# Patient Record
Sex: Female | Born: 1965 | Race: Black or African American | Hispanic: No | Marital: Single | Smoking: Never smoker
Health system: Southern US, Community
[De-identification: ages and names within clinical notes are randomized; demographics above are authoritative.]

## PROBLEM LIST (undated history)

## (undated) DIAGNOSIS — I10 Essential (primary) hypertension: Secondary | ICD-10-CM

## (undated) DIAGNOSIS — Z9884 Bariatric surgery status: Secondary | ICD-10-CM

## (undated) DIAGNOSIS — M170 Bilateral primary osteoarthritis of knee: Secondary | ICD-10-CM

## (undated) DIAGNOSIS — J302 Other seasonal allergic rhinitis: Secondary | ICD-10-CM

## (undated) DIAGNOSIS — I519 Heart disease, unspecified: Secondary | ICD-10-CM

## (undated) DIAGNOSIS — E611 Iron deficiency: Secondary | ICD-10-CM

## (undated) DIAGNOSIS — I4891 Unspecified atrial fibrillation: Secondary | ICD-10-CM

## (undated) DIAGNOSIS — I340 Nonrheumatic mitral (valve) insufficiency: Secondary | ICD-10-CM

## (undated) DIAGNOSIS — E8881 Metabolic syndrome: Secondary | ICD-10-CM

## (undated) DIAGNOSIS — M5430 Sciatica, unspecified side: Secondary | ICD-10-CM

## (undated) DIAGNOSIS — R7301 Impaired fasting glucose: Secondary | ICD-10-CM

## (undated) DIAGNOSIS — G8929 Other chronic pain: Secondary | ICD-10-CM

## (undated) DIAGNOSIS — J0110 Acute frontal sinusitis, unspecified: Secondary | ICD-10-CM

## (undated) DIAGNOSIS — M25562 Pain in left knee: Secondary | ICD-10-CM

## (undated) DIAGNOSIS — M1990 Unspecified osteoarthritis, unspecified site: Secondary | ICD-10-CM

## (undated) DIAGNOSIS — J309 Allergic rhinitis, unspecified: Secondary | ICD-10-CM

## (undated) DIAGNOSIS — J45909 Unspecified asthma, uncomplicated: Secondary | ICD-10-CM

## (undated) DIAGNOSIS — M545 Low back pain: Secondary | ICD-10-CM

## (undated) DIAGNOSIS — Z8679 Personal history of other diseases of the circulatory system: Secondary | ICD-10-CM

## (undated) DIAGNOSIS — M25561 Pain in right knee: Secondary | ICD-10-CM

## (undated) DIAGNOSIS — E785 Hyperlipidemia, unspecified: Secondary | ICD-10-CM

## (undated) DIAGNOSIS — F329 Major depressive disorder, single episode, unspecified: Secondary | ICD-10-CM

## (undated) HISTORY — DX: Allergic rhinitis, unspecified: J30.9

## (undated) HISTORY — DX: Low back pain: M54.5

## (undated) HISTORY — DX: Hyperlipidemia, unspecified: E78.5

## (undated) HISTORY — DX: Acute frontal sinusitis, unspecified: J01.10

## (undated) HISTORY — DX: Nonrheumatic mitral (valve) insufficiency: I34.0

## (undated) HISTORY — DX: Morbid (severe) obesity due to excess calories: E66.01

## (undated) HISTORY — DX: Major depressive disorder, single episode, unspecified: F32.9

## (undated) HISTORY — DX: Metabolic syndrome: E88.81

## (undated) HISTORY — DX: Sciatica, unspecified side: M54.30

## (undated) HISTORY — DX: Other seasonal allergic rhinitis: J30.2

## (undated) HISTORY — DX: Unspecified atrial fibrillation: I48.91

## (undated) HISTORY — DX: Heart disease, unspecified: I51.9

## (undated) HISTORY — DX: Impaired fasting glucose: R73.01

## (undated) HISTORY — DX: Bariatric surgery status: Z98.84

## (undated) HISTORY — DX: Personal history of other diseases of the circulatory system: Z86.79

## (undated) HISTORY — DX: Essential (primary) hypertension: I10

## (undated) HISTORY — DX: Pain in right knee: M25.561

## (undated) HISTORY — PX: LAPAROSCOPIC GASTRIC SLEEVE RESECTION: SHX5895

## (undated) HISTORY — DX: Other chronic pain: G89.29

## (undated) HISTORY — DX: Iron deficiency: E61.1

## (undated) HISTORY — DX: Pain in left knee: M25.562

## (undated) HISTORY — DX: Bilateral primary osteoarthritis of knee: M17.0

## (undated) HISTORY — PX: PARTIAL HYSTERECTOMY: SHX80

---

## 1999-12-31 ENCOUNTER — Emergency Department (HOSPITAL_COMMUNITY): Admission: EM | Admit: 1999-12-31 | Discharge: 2000-01-01 | Payer: Self-pay | Admitting: Emergency Medicine

## 2000-04-09 ENCOUNTER — Emergency Department (HOSPITAL_COMMUNITY): Admission: EM | Admit: 2000-04-09 | Discharge: 2000-04-09 | Payer: Self-pay | Admitting: Emergency Medicine

## 2000-04-12 ENCOUNTER — Emergency Department (HOSPITAL_COMMUNITY): Admission: EM | Admit: 2000-04-12 | Discharge: 2000-04-12 | Payer: Self-pay | Admitting: Emergency Medicine

## 2001-05-23 ENCOUNTER — Emergency Department (HOSPITAL_COMMUNITY): Admission: EM | Admit: 2001-05-23 | Discharge: 2001-05-23 | Payer: Self-pay

## 2001-08-21 ENCOUNTER — Encounter: Admission: RE | Admit: 2001-08-21 | Discharge: 2001-08-21 | Payer: Self-pay | Admitting: Family Medicine

## 2001-08-21 ENCOUNTER — Encounter: Payer: Self-pay | Admitting: Family Medicine

## 2001-08-29 ENCOUNTER — Encounter: Payer: Self-pay | Admitting: Family Medicine

## 2001-08-29 ENCOUNTER — Encounter: Admission: RE | Admit: 2001-08-29 | Discharge: 2001-08-29 | Payer: Self-pay | Admitting: Family Medicine

## 2002-06-13 ENCOUNTER — Other Ambulatory Visit: Admission: RE | Admit: 2002-06-13 | Discharge: 2002-06-13 | Payer: Self-pay | Admitting: Obstetrics and Gynecology

## 2002-06-15 ENCOUNTER — Ambulatory Visit (HOSPITAL_COMMUNITY): Admission: RE | Admit: 2002-06-15 | Discharge: 2002-06-15 | Payer: Self-pay | Admitting: Obstetrics and Gynecology

## 2002-06-15 ENCOUNTER — Encounter: Payer: Self-pay | Admitting: Obstetrics and Gynecology

## 2002-08-06 ENCOUNTER — Encounter: Payer: Self-pay | Admitting: Obstetrics and Gynecology

## 2002-08-06 ENCOUNTER — Ambulatory Visit (HOSPITAL_COMMUNITY): Admission: RE | Admit: 2002-08-06 | Discharge: 2002-08-06 | Payer: Self-pay | Admitting: Obstetrics and Gynecology

## 2009-05-14 ENCOUNTER — Emergency Department (HOSPITAL_COMMUNITY): Admission: EM | Admit: 2009-05-14 | Discharge: 2009-05-14 | Payer: Self-pay | Admitting: Emergency Medicine

## 2010-08-17 LAB — BASIC METABOLIC PANEL
BUN: 5 mg/dL — ABNORMAL LOW (ref 6–23)
Chloride: 103 mEq/L (ref 96–112)
Glucose, Bld: 93 mg/dL (ref 70–99)
Potassium: 3.5 mEq/L (ref 3.5–5.1)
Sodium: 137 mEq/L (ref 135–145)

## 2010-08-17 LAB — CBC
HCT: 30.5 % — ABNORMAL LOW (ref 36.0–46.0)
Hemoglobin: 10 g/dL — ABNORMAL LOW (ref 12.0–15.0)
MCV: 74.8 fL — ABNORMAL LOW (ref 78.0–100.0)
Platelets: 776 10*3/uL — ABNORMAL HIGH (ref 150–400)
RDW: 16.9 % — ABNORMAL HIGH (ref 11.5–15.5)

## 2010-08-17 LAB — DIFFERENTIAL
Basophils Absolute: 0.1 10*3/uL (ref 0.0–0.1)
Eosinophils Absolute: 0.1 10*3/uL (ref 0.0–0.7)
Eosinophils Relative: 1 % (ref 0–5)
Lymphocytes Relative: 14 % (ref 12–46)
Lymphs Abs: 1.6 10*3/uL (ref 0.7–4.0)
Monocytes Absolute: 1 10*3/uL (ref 0.1–1.0)

## 2010-08-17 LAB — BRAIN NATRIURETIC PEPTIDE: Pro B Natriuretic peptide (BNP): 320 pg/mL — ABNORMAL HIGH (ref 0.0–100.0)

## 2010-08-17 LAB — POCT CARDIAC MARKERS: Troponin i, poc: <0.05 ng/mL (ref 0.00–0.09)

## 2011-12-10 DIAGNOSIS — E611 Iron deficiency: Secondary | ICD-10-CM

## 2011-12-10 DIAGNOSIS — M5430 Sciatica, unspecified side: Secondary | ICD-10-CM

## 2011-12-10 HISTORY — DX: Sciatica, unspecified side: M54.30

## 2011-12-10 HISTORY — DX: Iron deficiency: E61.1

## 2012-11-06 DIAGNOSIS — R7301 Impaired fasting glucose: Secondary | ICD-10-CM

## 2012-11-06 DIAGNOSIS — K760 Fatty (change of) liver, not elsewhere classified: Secondary | ICD-10-CM | POA: Insufficient documentation

## 2012-11-06 HISTORY — DX: Impaired fasting glucose: R73.01

## 2013-02-07 DIAGNOSIS — I4891 Unspecified atrial fibrillation: Secondary | ICD-10-CM

## 2013-02-07 DIAGNOSIS — J302 Other seasonal allergic rhinitis: Secondary | ICD-10-CM

## 2013-02-07 HISTORY — DX: Unspecified atrial fibrillation: I48.91

## 2013-02-07 HISTORY — DX: Other seasonal allergic rhinitis: J30.2

## 2013-03-15 DIAGNOSIS — J45909 Unspecified asthma, uncomplicated: Secondary | ICD-10-CM | POA: Insufficient documentation

## 2013-03-15 DIAGNOSIS — M1990 Unspecified osteoarthritis, unspecified site: Secondary | ICD-10-CM | POA: Insufficient documentation

## 2013-04-26 DIAGNOSIS — I340 Nonrheumatic mitral (valve) insufficiency: Secondary | ICD-10-CM

## 2013-04-26 HISTORY — DX: Nonrheumatic mitral (valve) insufficiency: I34.0

## 2013-05-01 DIAGNOSIS — I519 Heart disease, unspecified: Secondary | ICD-10-CM

## 2013-05-01 HISTORY — DX: Heart disease, unspecified: I51.9

## 2013-06-01 DIAGNOSIS — K912 Postsurgical malabsorption, not elsewhere classified: Secondary | ICD-10-CM | POA: Insufficient documentation

## 2013-06-12 DIAGNOSIS — Z9884 Bariatric surgery status: Secondary | ICD-10-CM | POA: Insufficient documentation

## 2013-06-12 HISTORY — DX: Bariatric surgery status: Z98.84

## 2013-06-15 DIAGNOSIS — J309 Allergic rhinitis, unspecified: Secondary | ICD-10-CM | POA: Insufficient documentation

## 2013-06-15 HISTORY — DX: Allergic rhinitis, unspecified: J30.9

## 2013-10-26 DIAGNOSIS — M170 Bilateral primary osteoarthritis of knee: Secondary | ICD-10-CM

## 2013-10-26 HISTORY — DX: Bilateral primary osteoarthritis of knee: M17.0

## 2014-06-13 ENCOUNTER — Emergency Department (HOSPITAL_COMMUNITY)
Admission: EM | Admit: 2014-06-13 | Discharge: 2014-06-13 | Disposition: A | Discharge status: Home / Self Care | Payer: Self-pay | Attending: Emergency Medicine | Admitting: Emergency Medicine

## 2014-06-13 ENCOUNTER — Encounter (HOSPITAL_COMMUNITY): Payer: Self-pay | Admitting: Emergency Medicine

## 2014-06-13 ENCOUNTER — Emergency Department (HOSPITAL_COMMUNITY): Payer: Self-pay

## 2014-06-13 DIAGNOSIS — R05 Cough: Secondary | ICD-10-CM

## 2014-06-13 DIAGNOSIS — J069 Acute upper respiratory infection, unspecified: Secondary | ICD-10-CM | POA: Insufficient documentation

## 2014-06-13 DIAGNOSIS — R059 Cough, unspecified: Secondary | ICD-10-CM

## 2014-06-13 DIAGNOSIS — Z8701 Personal history of pneumonia (recurrent): Secondary | ICD-10-CM | POA: Insufficient documentation

## 2014-06-13 DIAGNOSIS — J45909 Unspecified asthma, uncomplicated: Secondary | ICD-10-CM | POA: Insufficient documentation

## 2014-06-13 DIAGNOSIS — Z8739 Personal history of other diseases of the musculoskeletal system and connective tissue: Secondary | ICD-10-CM | POA: Insufficient documentation

## 2014-06-13 HISTORY — DX: Unspecified asthma, uncomplicated: J45.909

## 2014-06-13 HISTORY — DX: Unspecified osteoarthritis, unspecified site: M19.90

## 2014-06-13 MED ORDER — HYDROCOD POLST-CHLORPHEN POLST 10-8 MG/5ML PO LQCR: 5.0000 mL | Freq: Two times a day (BID) | ORAL | Status: DC | PRN

## 2014-06-13 NOTE — ED Notes (Signed)
Pt c/o URI sx and cough x 1 week; pt sts some fever but not at present

## 2014-06-13 NOTE — ED Provider Notes (Signed)
CSN: [id number]     Arrival date & time 06/13/14  1545 History   First MD Initiated Contact with Patient 06/13/14 1630     Chief Complaint  Patient presents with  . URI  . Cough     (Consider location/radiation/quality/duration/timing/severity/associated sxs/prior Treatment) HPI  49 year old female presents with cough, congestion, headache, and sore throat for the past 1 week. Earlier had low-grade fevers, did not check her temperature. This fevers have resolved. No shortness of breath. No chest pain. Does feel like her chest is congested. Off was initially productive and is now nonproductive. A couple years ago she had similar symptoms, waited too long, and then was diagnosed with pneumonia. She is coming to the ER to make sure he does not pneumonia. Patient is not a smoker. Does endorse a prior history of bronchitis as well. She's been taking Mucinex and Tussen with no relief. Symptoms have not been worsening but have not improved over the 1 week.  Past Medical History  Diagnosis Date  . Arthritis   . Asthma    History reviewed. No pertinent past surgical history. History reviewed. No pertinent family history. History  Substance Use Topics  . Smoking status: Never Smoker   . Smokeless tobacco: Not on file  . Alcohol Use: No   OB History    No data available     Review of Systems  Constitutional: Negative for fever and chills.  HENT: Positive for congestion, sinus pressure and sore throat.   Respiratory: Positive for cough. Negative for shortness of breath and wheezing.   Cardiovascular: Negative for chest pain.  Gastrointestinal: Negative for vomiting.  Neurological: Positive for headaches.  All other systems reviewed and are negative.     Allergies  Review of patient's allergies indicates no known allergies.  Home Medications   Prior to Admission medications   Not on File   BP 135/92 mmHg  Pulse 90  Temp(Src) 98.2 F (36.8 C) (Oral)  Resp 18  SpO2  100% Physical Exam  Constitutional: She is oriented to person, place, and time. She appears well-developed and well-nourished.  HENT:  Head: Normocephalic and atraumatic.  Right Ear: Tympanic membrane and external ear normal.  Left Ear: Tympanic membrane and external ear normal.  Nose: Right sinus exhibits frontal sinus tenderness (mild). Right sinus exhibits no maxillary sinus tenderness. Left sinus exhibits frontal sinus tenderness (mild). Left sinus exhibits no maxillary sinus tenderness.  Mouth/Throat: Oropharynx is clear and moist and mucous membranes are normal. No oropharyngeal exudate, posterior oropharyngeal edema or posterior oropharyngeal erythema.  Eyes: Right eye exhibits no discharge. Left eye exhibits no discharge.  Neck: Neck supple.  Cardiovascular: Normal rate, regular rhythm and normal heart sounds.   Pulmonary/Chest: Effort normal and breath sounds normal. She has no wheezes. She has no rales.  Abdominal: Soft. There is no tenderness.  Lymphadenopathy:    She has no cervical adenopathy.  Neurological: She is alert and oriented to person, place, and time.  Skin: Skin is warm and dry.  Nursing note and vitals reviewed.   ED Course  Procedures (including critical care time) Labs Review Labs Reviewed - No data to display  Imaging Review Dg Chest 2 View (if Patient Has Fever And/or Copd)  06/13/2014   CLINICAL DATA:  Cough and congestion.  History of asthma.  EXAM: CHEST - 2 VIEW  COMPARISON:  05/14/2009  FINDINGS: The heart size and mediastinal contours are within normal limits. There is no evidence of pulmonary edema, consolidation, pneumothorax, nodule or  pleural fluid. The visualized skeletal structures are unremarkable.  IMPRESSION: No active disease.   Electronically Signed   By: Irish LackGlenn  Yamagata M.D.   On: 06/13/2014 17:21     EKG Interpretation None      MDM   Final diagnoses:  Cough  Upper respiratory infection    Patient symptoms are c/w a viral URI.  No evidence of PNA. Well appearing, appears well hydrated. Will treat cough with Tussionex, and treat other URI symptoms with symptomatic over-the-counter care. Mild sinus tenderness but only 1 week of symptoms, in setting of URI I do not feel she needs antibiotics at this time, will recommend f/u with PCP    Audree CamelScott T Allycia Pitz, MD 06/13/14 1801

## 2014-06-13 NOTE — Discharge Instructions (Signed)
Cough, Adult ° A cough is a reflex that helps clear your throat and airways. It can help heal the body or may be a reaction to an irritated airway. A cough may only last 2 or 3 weeks (acute) or may last more than 8 weeks (chronic).  °CAUSES °Acute cough: °· Viral or bacterial infections. °Chronic cough: °· Infections. °· Allergies. °· Asthma. °· Post-nasal drip. °· Smoking. °· Heartburn or acid reflux. °· Some medicines. °· Chronic lung problems (COPD). °· Cancer. °SYMPTOMS  °· Cough. °· Fever. °· Chest pain. °· Increased breathing rate. °· High-pitched whistling sound when breathing (wheezing). °· Colored mucus that you cough up (sputum). °TREATMENT  °· A bacterial cough may be treated with antibiotic medicine. °· A viral cough must run its course and will not respond to antibiotics. °· Your caregiver may recommend other treatments if you have a chronic cough. °HOME CARE INSTRUCTIONS  °· Only take over-the-counter or prescription medicines for pain, discomfort, or fever as directed by your caregiver. Use cough suppressants only as directed by your caregiver. °· Use a cold steam vaporizer or humidifier in your bedroom or home to help loosen secretions. °· Sleep in a semi-upright position if your cough is worse at night. °· Rest as needed. °· Stop smoking if you smoke. °SEEK IMMEDIATE MEDICAL CARE IF:  °· You have pus in your sputum. °· Your cough starts to worsen. °· You cannot control your cough with suppressants and are losing sleep. °· You begin coughing up blood. °· You have difficulty breathing. °· You develop pain which is getting worse or is uncontrolled with medicine. °· You have a fever. °MAKE SURE YOU:  °· Understand these instructions. °· Will watch your condition. °· Will get help right away if you are not doing well or get worse. °Document Released: 10/30/2010 Document Revised: 07/26/2011 Document Reviewed: 10/30/2010 °ExitCare® Patient Information ©2015 [employer]. This information is not intended  to replace advice given to you by your health care provider. Make sure you discuss any questions you have with your health care provider. °Upper Respiratory Infection, Adult °An upper respiratory infection (URI) is also sometimes known as the common cold. The upper respiratory tract includes the nose, sinuses, throat, trachea, and bronchi. Bronchi are the airways leading to the lungs. Most people improve within 1 week, but symptoms can last up to 2 weeks. A residual cough may last even longer.  °CAUSES °Many different viruses can infect the tissues lining the upper respiratory tract. The tissues become irritated and inflamed and often become very moist. Mucus production is also common. A cold is contagious. You can easily spread the virus to others by oral contact. This includes kissing, sharing a glass, coughing, or sneezing. Touching your mouth or nose and then touching a surface, which is then touched by another person, can also spread the virus. °SYMPTOMS  °Symptoms typically develop 1 to 3 days after you come in contact with a cold virus. Symptoms vary from person to person. They may include: °· Runny nose. °· Sneezing. °· Nasal congestion. °· Sinus irritation. °· Sore throat. °· Loss of voice (laryngitis). °· Cough. °· Fatigue. °· Muscle aches. °· Loss of appetite. °· Headache. °· Low-grade fever. °DIAGNOSIS  °You might diagnose your own cold based on familiar symptoms, since most people get a cold 2 to 3 times a year. Your caregiver can confirm this based on your exam. Most importantly, your caregiver can check that your symptoms are not due to another disease such   as strep throat, sinusitis, pneumonia, asthma, or epiglottitis. Blood tests, throat tests, and X-rays are not necessary to diagnose a common cold, but they may sometimes be helpful in excluding other more serious diseases. Your caregiver will decide if any further tests are required. °RISKS AND COMPLICATIONS  °You may be at risk for a more severe  case of the common cold if you smoke cigarettes, have chronic heart disease (such as heart failure) or lung disease (such as asthma), or if you have a weakened immune system. The very young and very old are also at risk for more serious infections. Bacterial sinusitis, middle ear infections, and bacterial pneumonia can complicate the common cold. The common cold can worsen asthma and chronic obstructive pulmonary disease (COPD). Sometimes, these complications can require emergency medical care and may be life-threatening. °PREVENTION  °The best way to protect against getting a cold is to practice good hygiene. Avoid oral or hand contact with people with cold symptoms. Wash your hands often if contact occurs. There is no clear evidence that vitamin C, vitamin E, echinacea, or exercise reduces the chance of developing a cold. However, it is always recommended to get plenty of rest and practice good nutrition. °TREATMENT  °Treatment is directed at relieving symptoms. There is no cure. Antibiotics are not effective, because the infection is caused by a virus, not by bacteria. Treatment may include: °· Increased fluid intake. Sports drinks offer valuable electrolytes, sugars, and fluids. °· Breathing heated mist or steam (vaporizer or shower). °· Eating chicken soup or other clear broths, and maintaining good nutrition. °· Getting plenty of rest. °· Using gargles or lozenges for comfort. °· Controlling fevers with ibuprofen or acetaminophen as directed by your caregiver. °· Increasing usage of your inhaler if you have asthma. °Zinc gel and zinc lozenges, taken in the first 24 hours of the common cold, can shorten the duration and lessen the severity of symptoms. Pain medicines may help with fever, muscle aches, and throat pain. A variety of non-prescription medicines are available to treat congestion and runny nose. Your caregiver can make recommendations and may suggest nasal or lung inhalers for other symptoms.  °HOME  CARE INSTRUCTIONS  °· Only take over-the-counter or prescription medicines for pain, discomfort, or fever as directed by your caregiver. °· Use a warm mist humidifier or inhale steam from a shower to increase air moisture. This may keep secretions moist and make it easier to breathe. °· Drink enough water and fluids to keep your urine clear or pale yellow. °· Rest as needed. °· Return to work when your temperature has returned to normal or as your caregiver advises. You may need to stay home longer to avoid infecting others. You can also use a face mask and careful hand washing to prevent spread of the virus. °SEEK MEDICAL CARE IF:  °· After the first few days, you feel you are getting worse rather than better. °· You need your caregiver's advice about medicines to control symptoms. °· You develop chills, worsening shortness of breath, or brown or red sputum. These may be signs of pneumonia. °· You develop yellow or brown nasal discharge or pain in the face, especially when you bend forward. These may be signs of sinusitis. °· You develop a fever, swollen neck glands, pain with swallowing, or white areas in the back of your throat. These may be signs of strep throat. °SEEK IMMEDIATE MEDICAL CARE IF:  °· You have a fever. °· You develop severe or persistent headache, ear   pain, sinus pain, or chest pain. °· You develop wheezing, a prolonged cough, cough up blood, or have a change in your usual mucus (if you have chronic lung disease). °· You develop sore muscles or a stiff neck. °Document Released: 10/27/2000 Document Revised: 07/26/2011 Document Reviewed: 08/08/2013 °ExitCare® Patient Information ©2015 [employer]. This information is not intended to replace advice given to you by your health care provider. Make sure you discuss any questions you have with your health care provider. ° °

## 2015-01-08 ENCOUNTER — Emergency Department (HOSPITAL_COMMUNITY): Payer: Self-pay

## 2015-01-08 ENCOUNTER — Encounter (HOSPITAL_COMMUNITY): Payer: Self-pay | Admitting: Emergency Medicine

## 2015-01-08 ENCOUNTER — Emergency Department (HOSPITAL_COMMUNITY)
Admission: EM | Admit: 2015-01-08 | Discharge: 2015-01-08 | Disposition: A | Discharge status: Home / Self Care | Payer: Self-pay | Attending: Emergency Medicine | Admitting: Emergency Medicine

## 2015-01-08 DIAGNOSIS — M546 Pain in thoracic spine: Secondary | ICD-10-CM | POA: Insufficient documentation

## 2015-01-08 DIAGNOSIS — R109 Unspecified abdominal pain: Secondary | ICD-10-CM | POA: Insufficient documentation

## 2015-01-08 DIAGNOSIS — J45909 Unspecified asthma, uncomplicated: Secondary | ICD-10-CM | POA: Insufficient documentation

## 2015-01-08 DIAGNOSIS — Z8679 Personal history of other diseases of the circulatory system: Secondary | ICD-10-CM | POA: Insufficient documentation

## 2015-01-08 HISTORY — DX: Unspecified atrial fibrillation: I48.91

## 2015-01-08 LAB — URINALYSIS, ROUTINE W REFLEX MICROSCOPIC
BILIRUBIN URINE: NEGATIVE
GLUCOSE, UA: NEGATIVE mg/dL
HGB URINE DIPSTICK: NEGATIVE
KETONES UR: NEGATIVE mg/dL
Leukocytes, UA: NEGATIVE
NITRITE: NEGATIVE
PH: 5.5 (ref 5.0–8.0)
Protein, ur: NEGATIVE mg/dL
SPECIFIC GRAVITY, URINE: 1.021 (ref 1.005–1.030)
Urobilinogen, UA: 0.2 mg/dL (ref 0.0–1.0)

## 2015-01-08 LAB — BASIC METABOLIC PANEL
Anion gap: 13 (ref 5–15)
BUN: 8 mg/dL (ref 6–20)
CALCIUM: 8.9 mg/dL (ref 8.9–10.3)
CHLORIDE: 102 mmol/L (ref 101–111)
CO2: 23 mmol/L (ref 22–32)
CREATININE: 0.93 mg/dL (ref 0.44–1.00)
GFR calc non Af Amer: >60 mL/min (ref >60)
Glucose, Bld: 97 mg/dL (ref 65–99)
Potassium: 4 mmol/L (ref 3.5–5.1)
SODIUM: 138 mmol/L (ref 135–145)

## 2015-01-08 LAB — CBC WITH DIFFERENTIAL/PLATELET
BASOS PCT: 0 % (ref 0–1)
Basophils Absolute: 0 10*3/uL (ref 0.0–0.1)
EOS ABS: 0.1 10*3/uL (ref 0.0–0.7)
Eosinophils Relative: 1 % (ref 0–5)
HCT: 41.8 % (ref 36.0–46.0)
HEMOGLOBIN: 13.8 g/dL (ref 12.0–15.0)
LYMPHS ABS: 1.9 10*3/uL (ref 0.7–4.0)
Lymphocytes Relative: 24 % (ref 12–46)
MCH: 28.6 pg (ref 26.0–34.0)
MCHC: 33 g/dL (ref 30.0–36.0)
MCV: 86.7 fL (ref 78.0–100.0)
MONO ABS: 0.8 10*3/uL (ref 0.1–1.0)
MONOS PCT: 10 % (ref 3–12)
NEUTROS PCT: 65 % (ref 43–77)
Neutro Abs: 4.9 10*3/uL (ref 1.7–7.7)
Platelets: 324 10*3/uL (ref 150–400)
RBC: 4.82 MIL/uL (ref 3.87–5.11)
RDW: 12.8 % (ref 11.5–15.5)
WBC: 7.7 10*3/uL (ref 4.0–10.5)

## 2015-01-08 MED ORDER — IBUPROFEN 800 MG PO TABS: 800.0000 mg | ORAL_TABLET | Freq: Three times a day (TID) | ORAL | Status: DC | PRN

## 2015-01-08 MED ORDER — OXYCODONE-ACETAMINOPHEN 5-325 MG PO TABS: 1.0000 | ORAL_TABLET | ORAL | Status: DC | PRN

## 2015-01-08 MED ORDER — OXYCODONE-ACETAMINOPHEN 5-325 MG PO TABS
2.0000 | ORAL_TABLET | Freq: Once | ORAL | Status: AC
  Administered 2015-01-08: 2 via ORAL
  Filled 2015-01-08: qty 2

## 2015-01-08 MED ORDER — ONDANSETRON 4 MG PO TBDP
4.0000 mg | ORAL_TABLET | Freq: Once | ORAL | Status: AC
  Administered 2015-01-08: 4 mg via ORAL
  Filled 2015-01-08: qty 1

## 2015-01-08 NOTE — ED Notes (Signed)
Pt stable, ambulatory, states understanding of discharge instructions 

## 2015-01-08 NOTE — ED Provider Notes (Signed)
TIME SEEN: 4:10 AM  CHIEF COMPLAINT: L flank pain  HPI: Pt is a 49 y.o. F with h.o morbid obesity, paroxysmal a fib, asthma who presents to the ED with left flank pain that started yesterday.  Denies known injury.  Denies chest pain or shortness of breath. No fevers or cough. No dysuria or hematuria. No abdominal pain. No vomiting or diarrhea. No numbness, tingling or focal weakness. Pain is worse with movement. Better with staying still. Denies history of kidney stones.  ROS: See HPI Constitutional: no fever  Eyes: no drainage  ENT: no runny nose   Cardiovascular:  no chest pain  Resp: no SOB  GI: no vomiting GU: no dysuria Integumentary: no rash  Allergy: no hives  Musculoskeletal: no leg swelling  Neurological: no slurred speech ROS otherwise negative  PAST MEDICAL HISTORY/PAST SURGICAL HISTORY:  Past Medical History  Diagnosis Date  . Arthritis   . Asthma   . A-fib     MEDICATIONS:  Prior to Admission medications   Medication Sig Start Date End Date Taking? Authorizing Provider  chlorpheniramine-HYDROcodone (TUSSIONEX PENNKINETIC ER) 10-8 MG/5ML LQCR Take 5 mLs by mouth every 12 (twelve) hours as needed for cough. 06/13/14   Pricilla Loveless, MD    ALLERGIES:  No Known Allergies  SOCIAL HISTORY:  Social History  Substance Use Topics  . Smoking status: Never Smoker   . Smokeless tobacco: Not on file  . Alcohol Use: No    FAMILY HISTORY: No family history on file.  EXAM: BP 119/75 mmHg  Pulse 86  Temp(Src) 98.6 F (37 C) (Oral)  Resp 20  Ht  (1.651 m)  Wt 276 lb (125.193 kg)  BMI 45.93 kg/m2  SpO2 99% CONSTITUTIONAL: Alert and oriented and responds appropriately to questions. Well-appearing; well-nourished, morbidly obese HEAD: Normocephalic EYES: Conjunctivae clear, PERRL ENT: normal nose; no rhinorrhea; moist mucous membranes; pharynx without lesions noted NECK: Supple, no meningismus, no LAD  CARD: RRR; S1 and S2 appreciated; no murmurs, no  clicks, no rubs, no gallops; tender to palpation over the left chest wall without crepitus or ecchymosis or deformity RESP: Normal chest excursion without splinting or tachypnea; breath sounds clear and equal bilaterally; no wheezes, no rhonchi, no rales, no hypoxia or respiratory distress, speaking full sentences ABD/GI: Normal bowel sounds; non-distended; soft, non-tender, no rebound, no guarding, no peritoneal signs BACK:  The back appears normal and is tender over the left flank, no midline spinal tenderness or step-off or deformity EXT: Normal ROM in all joints; non-tender to palpation; no edema; normal capillary refill; no cyanosis, no calf tenderness or swelling    SKIN: Normal color for age and race; warm NEURO: Moves all extremities equally, sensation to light touch intact diffusely, cranial nerves II through XII intact PSYCH: The patient's mood and manner are appropriate. Grooming and personal hygiene are appropriate.  MEDICAL DECISION MAKING: Patient here with left flank pain. Likely ask your skeletal in nature but given her morbid obesity exam is very limited. She is hemodynamically stable and neurologically intact. Will obtain labs, urine, left rib series, CT of her abdomen and pelvis to rule out kidney stone. We'll provide pain medication.  ED PROGRESS: Patient reports pain is improved significantly after Percocet. Labs unremarkable. Urine shows no hematuria or sign of infection. CT scan shows a small punctate stone in the left kidney but no ureterolithiasis. Chest x-ray shows mild cardiomegaly but no other acute abnormality. She also has mild hepatomegaly. Discussed these incidental findings with patient. She now  reports that she has begun working out at [employer] and has been doing "rowing" like exercises that she feels likely caused her left back pain. I feel she is safe to be discharged home. We'll discharge with pain medication, anti-inflammatory's. I recommended stretching,  alternate ice and heat as needed for pain. I do not feel she needs further emergent workup at this time. Discussed usual and customary return precautions. Provided outpatient follow-up information. She verbalized understanding and discomfort with this plan.     Kara Maw Corrado Hymon, DO 01/08/15 [phone]

## 2015-01-08 NOTE — ED Notes (Signed)
Pt reports L flank pain since yesterday. Denies urinary sx. Denies injury.

## 2015-01-08 NOTE — Discharge Instructions (Signed)
Thoracic Strain You have injured the muscles or tendons that attach to the upper part of your back behind your chest. This injury is called a thoracic strain, thoracic sprain, or mid-back strain.  CAUSES  The cause of thoracic strain varies. A less severe injury involves pulling a muscle or tendon without tearing it. A more severe injury involves tearing (rupturing) a muscle or tendon. With less severe injuries, there may be little loss of strength. Sometimes, there are breaks (fractures) in the bones to which the muscles are attached. These fractures are rare, unless there was a direct hit (trauma) or you have weak bones due to osteoporosis or age. Longstanding strains may be caused by overuse or improper form during certain movements. Obesity can also increase your risk for back injuries. Sudden strains may occur due to injury or not warming up properly before exercise. Often, there is no obvious cause for a thoracic strain. SYMPTOMS  The main symptom is pain, especially with movement, such as during exercise. DIAGNOSIS  Your caregiver can usually tell what is wrong by taking an X-ray and doing a physical exam. TREATMENT   Physical therapy may be helpful for recovery. Your caregiver can give you exercises to do or refer you to a physical therapist after your pain improves.  After your pain improves, strengthening and conditioning programs appropriate for your sport or occupation may be helpful.  Always warm up before physical activities or athletics. Stretching after physical activity may also help.  Certain over-the-counter medicines may also help. Ask your caregiver if there are medicines that would help you. If this is your first thoracic strain injury, proper care and proper healing time before starting activities should prevent long-term problems. Torn ligaments and tendons require as long to heal as broken bones. Average healing times may be only 1 week for a mild strain. For torn muscles  and tendons, healing time may be up to 6 weeks to 2 months. HOME CARE INSTRUCTIONS   Apply ice to the injured area. Ice massages may also be used as directed.  Put ice in a plastic bag.  Place a towel between your skin and the bag.  Leave the ice on for 15-20 minutes, 03-04 times a day, for the first 2 days.  Only take over-the-counter or prescription medicines for pain, discomfort, or fever as directed by your caregiver.  Keep your appointments for physical therapy if this was prescribed.  Use wraps and back braces as instructed. SEEK IMMEDIATE MEDICAL CARE IF:   You have an increase in bruising, swelling, or pain.  Your pain has not improved with medicines.  You develop new shortness of breath, chest pain, or fever.  Problems seem to be getting worse rather than better. MAKE SURE YOU:   Understand these instructions.  Will watch your condition.  Will get help right away if you are not doing well or get worse. Document Released: 07/24/2003 Document Revised: 07/26/2011 Document Reviewed: 06/19/2010 [employer] Patient Information 2015 [employer]. This information is not intended to replace advice given to you by your health care provider. Make sure you discuss any questions you have with your health care provider.  

## 2015-01-10 ENCOUNTER — Encounter (HOSPITAL_COMMUNITY): Payer: Self-pay | Admitting: Emergency Medicine

## 2015-01-10 ENCOUNTER — Emergency Department (HOSPITAL_COMMUNITY)
Admission: EM | Admit: 2015-01-10 | Discharge: 2015-01-10 | Disposition: A | Discharge status: Home / Self Care | Payer: Self-pay | Attending: Emergency Medicine | Admitting: Emergency Medicine

## 2015-01-10 DIAGNOSIS — M1990 Unspecified osteoarthritis, unspecified site: Secondary | ICD-10-CM | POA: Insufficient documentation

## 2015-01-10 DIAGNOSIS — Z79899 Other long term (current) drug therapy: Secondary | ICD-10-CM | POA: Insufficient documentation

## 2015-01-10 DIAGNOSIS — M5432 Sciatica, left side: Secondary | ICD-10-CM | POA: Insufficient documentation

## 2015-01-10 DIAGNOSIS — J45909 Unspecified asthma, uncomplicated: Secondary | ICD-10-CM | POA: Insufficient documentation

## 2015-01-10 LAB — CBC
HCT: 38.3 % (ref 36.0–46.0)
Hemoglobin: 12.7 g/dL (ref 12.0–15.0)
MCH: 28.5 pg (ref 26.0–34.0)
MCHC: 33.2 g/dL (ref 30.0–36.0)
MCV: 86.1 fL (ref 78.0–100.0)
PLATELETS: 334 10*3/uL (ref 150–400)
RBC: 4.45 MIL/uL (ref 3.87–5.11)
RDW: 12.6 % (ref 11.5–15.5)
WBC: 7.8 10*3/uL (ref 4.0–10.5)

## 2015-01-10 LAB — COMPREHENSIVE METABOLIC PANEL
ALBUMIN: 3.6 g/dL (ref 3.5–5.0)
ALK PHOS: 85 U/L (ref 38–126)
ALT: 14 U/L (ref 14–54)
AST: 22 U/L (ref 15–41)
Anion gap: 11 (ref 5–15)
BILIRUBIN TOTAL: 0.5 mg/dL (ref 0.3–1.2)
BUN: 13 mg/dL (ref 6–20)
CALCIUM: 9.2 mg/dL (ref 8.9–10.3)
CO2: 26 mmol/L (ref 22–32)
CREATININE: 0.89 mg/dL (ref 0.44–1.00)
Chloride: 101 mmol/L (ref 101–111)
GFR calc non Af Amer: >60 mL/min (ref >60)
GLUCOSE: 98 mg/dL (ref 65–99)
Potassium: 4 mmol/L (ref 3.5–5.1)
SODIUM: 138 mmol/L (ref 135–145)
TOTAL PROTEIN: 8 g/dL (ref 6.5–8.1)

## 2015-01-10 LAB — LIPASE, BLOOD: Lipase: 25 U/L (ref 22–51)

## 2015-01-10 MED ORDER — PREDNISONE 20 MG PO TABS: ORAL_TABLET | ORAL | Status: DC

## 2015-01-10 MED ORDER — PREDNISONE 20 MG PO TABS
60.0000 mg | ORAL_TABLET | Freq: Once | ORAL | Status: AC
  Administered 2015-01-10: 60 mg via ORAL
  Filled 2015-01-10 (×2): qty 3

## 2015-01-10 MED ORDER — METHOCARBAMOL 500 MG PO TABS
750.0000 mg | ORAL_TABLET | Freq: Once | ORAL | Status: AC
  Administered 2015-01-10: 750 mg via ORAL
  Filled 2015-01-10: qty 2

## 2015-01-10 MED ORDER — METHOCARBAMOL 500 MG PO TABS: 500.0000 mg | ORAL_TABLET | Freq: Two times a day (BID) | ORAL | Status: DC

## 2015-01-10 NOTE — ED Provider Notes (Signed)
CSN: [id number]     Arrival date & time 01/10/15  1850 History   First MD Initiated Contact with Patient 01/10/15 2140     Chief Complaint  Patient presents with  . Abdominal Pain  . Flank Pain     (Consider location/radiation/quality/duration/timing/severity/associated sxs/prior Treatment) HPI Comments: Patient was seen 2 days ago for the same complaint of left sided pain.  She states the pain starts under her ribs on the left side and extends into the hip and down the side and back of her left leg.  It's worse with position changes.  She is having trouble sleeping at night due to the muscle spasms.  She also is having severe muscle spasms with position change.  Her examination.  2 days ago revealed that she has a punctate kidney stone, but her labs were all significantly normal.  She was given a prescription for ibuprofen and Percocet, which she states is not helping her pain is just getting worse. She is morbidly obese but is been in a weight loss program and has had bariatric surgery.  She has lots of loose skin,  no obvious sign of a hernia.  Patient is a 49 y.o. female presenting with abdominal pain. The history is provided by the patient.  Abdominal Pain Pain location:  L flank Pain quality: aching   Pain radiates to:  Back and L leg Pain severity:  Moderate Onset quality:  Gradual Timing:  Constant Progression:  Worsening Chronicity:  Recurrent Relieved by:  Nothing Worsened by:  Palpation and position changes Ineffective treatments:  NSAIDs Associated symptoms: no cough, no dysuria, no fever, no hematuria, no nausea, no shortness of breath and no vomiting     Past Medical History  Diagnosis Date  . Arthritis   . Asthma   . A-fib    Past Surgical History  Procedure Laterality Date  . Partial hysterectomy     No family history on file. Social History  Substance Use Topics  . Smoking status: Never Smoker   . Smokeless tobacco: None  . Alcohol Use: No   OB History     No data available     Review of Systems  Constitutional: Negative for fever.  Respiratory: Negative for cough and shortness of breath.   Gastrointestinal: Positive for abdominal pain. Negative for nausea and vomiting.  Genitourinary: Positive for flank pain. Negative for dysuria and hematuria.  Musculoskeletal: Positive for back pain and gait problem. Negative for myalgias and neck pain.  Skin: Negative for rash and wound.  All other systems reviewed and are negative.     Allergies  Review of patient's allergies indicates no known allergies.  Home Medications   Prior to Admission medications   Medication Sig Start Date End Date Taking? Authorizing Provider  ibuprofen (ADVIL,MOTRIN) 800 MG tablet Take 1 tablet (800 mg total) by mouth every 8 (eight) hours as needed for mild pain. 01/08/15  Yes Kristen N Ward, DO  oxyCODONE-acetaminophen (PERCOCET/ROXICET) 5-325 MG per tablet Take 1 tablet by mouth every 4 (four) hours as needed. 01/08/15  Yes Kristen N Ward, DO  methocarbamol (ROBAXIN) 500 MG tablet Take 1 tablet (500 mg total) by mouth 2 (two) times daily. 01/10/15   Earley Favor, NP  predniSONE (DELTASONE) 20 MG tablet 3 Tabs PO Days 1-3, then 2 tabs PO Days 4-6, then 1 tab PO Day 7-9, then Half Tab PO Day 10-12 01/10/15   Earley Favor, NP   BP 132/72 mmHg  Pulse 82  Temp(Src) 98  F (36.7 C) (Oral)  Resp 18  SpO2 99% Physical Exam  Constitutional: She appears well-developed and well-nourished.  HENT:  Head: Normocephalic.  Eyes: Pupils are equal, round, and reactive to light.  Neck: Normal range of motion.  Cardiovascular: Normal rate and regular rhythm.   Pulmonary/Chest: Effort normal and breath sounds normal.  Abdominal: Soft. She exhibits no distension. There is no tenderness.  Exam is limited by body habitus  Musculoskeletal: Normal range of motion. She exhibits tenderness.       Arms: Nursing note and vitals reviewed.   ED Course  Procedures (including critical  care time) Labs Review Labs Reviewed  LIPASE, BLOOD  COMPREHENSIVE METABOLIC PANEL  CBC  URINALYSIS, ROUTINE W REFLEX MICROSCOPIC (NOT AT [facility])    Imaging Review No results found. I have personally reviewed and evaluated these images and lab results as part of my medical decision-making.   EKG Interpretation None     His examination is consistent with sciatica.  She will be put on a 12 day sterilely taper and Robaxin for muscle spasm  She has Percocet for pain control if needed.  I discussed at length positioning while sleeping.  Exercising.  She is also been given a number for case management  to call on Monday so that she can help her get/obtain a primary care physician as the patient did call the [facility] yesterday and was told they are not excepting the patient's MDM   Final diagnoses:  Sciatica neuralgia, left         Earley Favor, NP 01/10/15 2207  Doug Sou, MD 01/11/15 [id number]

## 2015-01-10 NOTE — ED Notes (Signed)
Pt. reports persistent left lateral abdominal and left flank pain onset last Wednesday with nausea , no emesis or diarrhea .

## 2015-01-10 NOTE — Discharge Instructions (Signed)
Given the first dose of muscle relaxer and steroidal in the emergency department.  You have  been given a prescription to fill start this tomorrow as directed.  Also have given her the phone number for one to our case manager/social worker, please call her on Monday after 9 AM she will help you in obtaining a primary care physician

## 2015-01-10 NOTE — ED Provider Notes (Signed)
Complains of pain going from left flank to left thigh improved with remaining still and worse with changing positions bending or getting up from [place] seated position. She denies any abdominal pain denies appetite changes no nausea or vomiting no fever no urinary symptoms. On exam patient is morbidly obese. Pain at left flank upon standing up from a seated position. Abdomen morbidly obese, nontender. Gait normal  Doug Sou, MD 01/10/15 2155

## 2015-01-13 ENCOUNTER — Telehealth: Payer: Self-pay | Admitting: Surgery

## 2015-01-13 NOTE — Telephone Encounter (Signed)
ED CM received call from patient regarding needing to establsih care for follow up. Reviewed record patient was seen in the ED 2 x last week for similar issues. Discussed the [facility] and the services rendered. Offered to assist with scheduling an appointment. Patient amendable, Appointment scheduled for Thurs. 9/8 at 9:30am with Holland Commons NP at the Clinic. Patient verbalized understanding and appreciation for the assistance. No further ED CM needs identfied.

## 2015-01-23 ENCOUNTER — Encounter: Payer: Self-pay | Admitting: Internal Medicine

## 2015-01-23 ENCOUNTER — Ambulatory Visit: Payer: Self-pay | Attending: Internal Medicine | Admitting: Internal Medicine

## 2015-01-23 VITALS — BP 121/83 | HR 76 | Temp 98.0°F | Resp 16 | Ht 65.0 in | Wt 293.2 lb

## 2015-01-23 DIAGNOSIS — Z6841 Body Mass Index (BMI) 40.0 and over, adult: Secondary | ICD-10-CM | POA: Insufficient documentation

## 2015-01-23 DIAGNOSIS — M5432 Sciatica, left side: Secondary | ICD-10-CM | POA: Insufficient documentation

## 2015-01-23 DIAGNOSIS — N200 Calculus of kidney: Secondary | ICD-10-CM | POA: Insufficient documentation

## 2015-01-23 MED ORDER — TRAMADOL HCL 50 MG PO TABS: 50.0000 mg | ORAL_TABLET | Freq: Three times a day (TID) | ORAL | Status: DC | PRN

## 2015-01-23 MED ORDER — IBUPROFEN 800 MG PO TABS: 800.0000 mg | ORAL_TABLET | Freq: Three times a day (TID) | ORAL | Status: DC | PRN

## 2015-01-23 NOTE — Progress Notes (Signed)
Patient ID: Kara Key, female   DOB: 02/18/1966, 49 y.o.   MRN: [id number]  CC: Back pain   HPI: Kara Key is a 49 y.o. female here today for a follow up visit.  Patient has past medical history of atrial fibrillation, asthma, morbid obesity, and arthritis. Patient reports that she has been seen by the ED twice in the past week for left sided back pain and sciatica. Patient reports that the pain begins under her ribs on the left side and extends into the hip and down the side and back of her left leg. It is aggravated by position changes. She is having trouble sleeping at night due to the muscle spasms. She also is having severe muscle spasms with position change. Her CT in the ER revealed a punctate kidney stone, but her labs were all significantly normal. She was given a prescription for ibuprofen and Percocet, which she states is not helping her pain.. She is requesting a refill of her ibuprofen.   She is morbidly obese but is been in a weight loss program and has had bariatric surgery in 2014 (gastric sleeve). She has lots of loose skin, no obvious sign of a hernia. No Known Allergies Past Medical History  Diagnosis Date  . Arthritis   . Asthma   . A-fib    Current Outpatient Prescriptions on File Prior to Visit  Medication Sig Dispense Refill  . ibuprofen (ADVIL,MOTRIN) 800 MG tablet Take 1 tablet (800 mg total) by mouth every 8 (eight) hours as needed for mild pain. 30 tablet 0  . oxyCODONE-acetaminophen (PERCOCET/ROXICET) 5-325 MG per tablet Take 1 tablet by mouth every 4 (four) hours as needed. 15 tablet 0  . methocarbamol (ROBAXIN) 500 MG tablet Take 1 tablet (500 mg total) by mouth 2 (two) times daily. 20 tablet 0  . predniSONE (DELTASONE) 20 MG tablet 3 Tabs PO Days 1-3, then 2 tabs PO Days 4-6, then 1 tab PO Day 7-9, then Half Tab PO Day 10-12 20 tablet 0   No current facility-administered medications on file prior to visit.   History reviewed. No pertinent family  history. Social History   Social History  . Marital Status: Single    Spouse Name: N/A  . Number of Children: N/A  . Years of Education: N/A   Occupational History  . Not on file.   Social History Main Topics  . Smoking status: Never Smoker   . Smokeless tobacco: Not on file  . Alcohol Use: No  . Drug Use: No  . Sexual Activity: Not on file   Other Topics Concern  . Not on file   Social History Narrative    Review of Systems: Other than what is stated in HPI, all other systems are negative.   Objective:   Filed Vitals:   01/23/15 0935  BP: 121/83  Pulse: 76  Temp: 98 F (36.7 C)  Resp: 16    Physical Exam  Constitutional: She is oriented to person, place, and time.  Morbidly obese  Cardiovascular: Normal rate, regular rhythm and normal heart sounds.   Pulmonary/Chest: Effort normal and breath sounds normal.  Abdominal:  No CVA tenderness  Musculoskeletal: Normal range of motion. She exhibits no tenderness.  Positive left straight leg raise  Neurological: She is alert and oriented to person, place, and time.     Lab Results  Component Value Date   WBC 7.8 01/10/2015   HGB 12.7 01/10/2015   HCT 38.3 01/10/2015   MCV 86.1  01/10/2015   PLT 334 01/10/2015   Lab Results  Component Value Date   CREATININE 0.89 01/10/2015   BUN 13 01/10/2015   NA 138 01/10/2015   K 4.0 01/10/2015   CL 101 01/10/2015   CO2 26 01/10/2015    No results found for: HGBA1C Lipid Panel  No results found for: CHOL, TRIG, HDL, CHOLHDL, VLDL, LDLCALC     Assessment and plan:   Kara Key was seen today for follow-up.  Diagnoses and all orders for this visit:  Left sided sciatica -     Refill ibuprofen (ADVIL,MOTRIN) 800 MG tablet; Take 1 tablet (800 mg total) by mouth every 8 (eight) hours as needed for mild pain. -    Refill traMADol (ULTRAM) 50 MG tablet; Take 1 tablet (50 mg total) by mouth every 8 (eight) hours as needed. Explained signs and symptoms that should  warrant immediate attention.  Patient verbalized understanding with teach back used.  Kidney stone on left side I do not believe pain on left side of back is caused by kidney stone at this time. Pain is more consistent with sciatica.   Obesity  I have stressed weight loss with patient and how it may help pressure on back and joints. Patient reports that she has plans to join the gym soon.   Return if symptoms worsen or fail to improve.       Ambrose Finland, NP-C [facility] [phone] 01/23/2015, 9:53 AM

## 2015-01-23 NOTE — Patient Instructions (Signed)
Kidney Stones Kidney stones (urolithiasis) are deposits that form inside your kidneys. The intense pain is caused by the stone moving through the urinary tract. When the stone moves, the ureter goes into spasm around the stone. The stone is usually passed in the urine.  CAUSES   A disorder that makes certain neck glands produce too much parathyroid hormone (primary hyperparathyroidism).  A buildup of uric acid crystals, similar to gout in your joints.  Narrowing (stricture) of the ureter.  A kidney obstruction present at birth (congenital obstruction).  Previous surgery on the kidney or ureters.  Numerous kidney infections. SYMPTOMS Sciatica Sciatica is pain, weakness, numbness, or tingling along the path of the sciatic nerve. The nerve starts in the lower back and runs down the back of each leg. The nerve controls the muscles in the lower leg and in the back of the knee, while also providing sensation to the back of the thigh, lower leg, and the sole of your foot. Sciatica is a symptom of another medical condition. For instance, nerve damage or certain conditions, such as a herniated disk or bone spur on the spine, pinch or put pressure on the sciatic nerve. This causes the pain, weakness, or other sensations normally associated with sciatica. Generally, sciatica only affects one side of the body. CAUSES  Herniated or slipped disc. Degenerative disk disease. A pain disorder involving the narrow muscle in the buttocks (piriformis syndrome). Pelvic injury or fracture. Pregnancy. Tumor (rare). SYMPTOMS  Symptoms can vary from mild to very severe. The symptoms usually travel from the low back to the buttocks and down the back of the leg. Symptoms can include: Mild tingling or dull aches in the lower back, leg, or hip. Numbness in the back of the calf or sole of the foot. Burning sensations in the lower back, leg, or hip. Sharp pains in the lower back, leg, or hip. Leg weakness. Severe  back pain inhibiting movement. These symptoms may get worse with coughing, sneezing, laughing, or prolonged sitting or standing. Also, being overweight may worsen symptoms. DIAGNOSIS  Your caregiver will perform a physical exam to look for common symptoms of sciatica. He or she may ask you to do certain movements or activities that would trigger sciatic nerve pain. Other tests may be performed to find the cause of the sciatica. These may include: Blood tests. X-rays. Imaging tests, such as an MRI or CT scan. TREATMENT  Treatment is directed at the cause of the sciatic pain. Sometimes, treatment is not necessary and the pain and discomfort goes away on its own. If treatment is needed, your caregiver may suggest: Over-the-counter medicines to relieve pain. Prescription medicines, such as anti-inflammatory medicine, muscle relaxants, or narcotics. Applying heat or ice to the painful area. Steroid injections to lessen pain, irritation, and inflammation around the nerve. Reducing activity during periods of pain. Exercising and stretching to strengthen your abdomen and improve flexibility of your spine. Your caregiver may suggest losing weight if the extra weight makes the back pain worse. Physical therapy. Surgery to eliminate what is pressing or pinching the nerve, such as a bone spur or part of a herniated disk. HOME CARE INSTRUCTIONS  Only take over-the-counter or prescription medicines for pain or discomfort as directed by your caregiver. Apply ice to the affected area for 20 minutes, 3-4 times a day for the first 48-72 hours. Then try heat in the same way. Exercise, stretch, or perform your usual activities if these do not aggravate your pain. Attend physical therapy  sessions as directed by your caregiver. Keep all follow-up appointments as directed by your caregiver. Do not wear high heels or shoes that do not provide proper support. Check your mattress to see if it is too soft. A firm  mattress may lessen your pain and discomfort. SEEK IMMEDIATE MEDICAL CARE IF:  You lose control of your bowel or bladder (incontinence). You have increasing weakness in the lower back, pelvis, buttocks, or legs. You have redness or swelling of your back. You have a burning sensation when you urinate. You have pain that gets worse when you lie down or awakens you at night. Your pain is worse than you have experienced in the past. Your pain is lasting longer than 4 weeks. You are suddenly losing weight without reason. MAKE SURE YOU: Understand these instructions. Will watch your condition. Will get help right away if you are not doing well or get worse. Document Released: 04/27/2001 Document Revised: 11/02/2011 Document Reviewed: 09/12/2011 [facility] Patient Information 2015 [place], [place]. This information is not intended to replace advice given to you by your health care provider. Make sure you discuss any questions you have with your health care provider.  Feeling sick to your stomach (nauseous).  Throwing up (vomiting).  Blood in the urine (hematuria).  Pain that usually spreads (radiates) to the groin.  Frequency or urgency of urination. DIAGNOSIS   Taking a history and physical exam.  Blood or urine tests.  CT scan.  Occasionally, an examination of the inside of the urinary bladder (cystoscopy) is performed. TREATMENT   Observation.  Increasing your fluid intake.  Extracorporeal shock wave lithotripsy--This is a noninvasive procedure that uses shock waves to break up kidney stones.  Surgery may be needed if you have severe pain or persistent obstruction. There are various surgical procedures. Most of the procedures are performed with the use of small instruments. Only small incisions are needed to accommodate these instruments, so recovery time is minimized. The size, location, and chemical composition are all important variables that will determine the proper choice  of action for you. Talk to your health care provider to better understand your situation so that you will minimize the risk of injury to yourself and your kidney.  HOME CARE INSTRUCTIONS   Drink enough water and fluids to keep your urine clear or pale yellow. This will help you to pass the stone or stone fragments.  Strain all urine through the provided strainer. Keep all particulate matter and stones for your health care provider to see. The stone causing the pain may be as small as a grain of salt. It is very important to use the strainer each and every time you pass your urine. The collection of your stone will allow your health care provider to analyze it and verify that a stone has actually passed. The stone analysis will often identify what you can do to reduce the incidence of recurrences.  Only take over-the-counter or prescription medicines for pain, discomfort, or fever as directed by your health care provider.  Make a follow-up appointment with your health care provider as directed.  Get follow-up X-rays if required. The absence of pain does not always mean that the stone has passed. It may have only stopped moving. If the urine remains completely obstructed, it can cause loss of kidney function or even complete destruction of the kidney. It is your responsibility to make sure X-rays and follow-ups are completed. Ultrasounds of the kidney can show blockages and the status of the kidney.  Ultrasounds are not associated with any radiation and can be performed easily in a matter of minutes. SEEK MEDICAL CARE IF:  You experience pain that is progressive and unresponsive to any pain medicine you have been prescribed. SEEK IMMEDIATE MEDICAL CARE IF:   Pain cannot be controlled with the prescribed medicine.  You have a fever or shaking chills.  The severity or intensity of pain increases over 18 hours and is not relieved by pain medicine.  You develop a new onset of abdominal pain.  You  feel faint or pass out.  You are unable to urinate. MAKE SURE YOU:   Understand these instructions.  Will watch your condition.  Will get help right away if you are not doing well or get worse. Document Released: 05/03/2005 Document Revised: 01/03/2013 Document Reviewed: 10/04/2012 [facility] Patient Information 2015 [place], [place]. This information is not intended to replace advice given to you by your health care provider. Make sure you discuss any questions you have with your health care provider.

## 2015-01-23 NOTE — Progress Notes (Signed)
Patient here to establish care Patient recently seen in the ed for sciatica Patient requesting a refill on her ibuprofen

## 2015-04-03 ENCOUNTER — Emergency Department (HOSPITAL_COMMUNITY)
Admission: EM | Admit: 2015-04-03 | Discharge: 2015-04-03 | Disposition: A | Discharge status: Home / Self Care | Payer: Self-pay | Attending: Emergency Medicine | Admitting: Emergency Medicine

## 2015-04-03 ENCOUNTER — Emergency Department (HOSPITAL_COMMUNITY): Payer: Self-pay

## 2015-04-03 ENCOUNTER — Encounter (HOSPITAL_COMMUNITY): Payer: Self-pay | Admitting: *Deleted

## 2015-04-03 DIAGNOSIS — Y9289 Other specified places as the place of occurrence of the external cause: Secondary | ICD-10-CM | POA: Insufficient documentation

## 2015-04-03 DIAGNOSIS — Z79899 Other long term (current) drug therapy: Secondary | ICD-10-CM | POA: Insufficient documentation

## 2015-04-03 DIAGNOSIS — S5002XA Contusion of left elbow, initial encounter: Secondary | ICD-10-CM | POA: Insufficient documentation

## 2015-04-03 DIAGNOSIS — J45909 Unspecified asthma, uncomplicated: Secondary | ICD-10-CM | POA: Insufficient documentation

## 2015-04-03 DIAGNOSIS — Z23 Encounter for immunization: Secondary | ICD-10-CM | POA: Insufficient documentation

## 2015-04-03 DIAGNOSIS — Y9389 Activity, other specified: Secondary | ICD-10-CM | POA: Insufficient documentation

## 2015-04-03 DIAGNOSIS — M1990 Unspecified osteoarthritis, unspecified site: Secondary | ICD-10-CM | POA: Insufficient documentation

## 2015-04-03 DIAGNOSIS — Y998 Other external cause status: Secondary | ICD-10-CM | POA: Insufficient documentation

## 2015-04-03 DIAGNOSIS — W010XXA Fall on same level from slipping, tripping and stumbling without subsequent striking against object, initial encounter: Secondary | ICD-10-CM | POA: Insufficient documentation

## 2015-04-03 DIAGNOSIS — I4891 Unspecified atrial fibrillation: Secondary | ICD-10-CM | POA: Insufficient documentation

## 2015-04-03 MED ORDER — TETANUS-DIPHTH-ACELL PERTUSSIS 5-2.5-18.5 LF-MCG/0.5 IM SUSP
0.5000 mL | Freq: Once | INTRAMUSCULAR | Status: AC
  Administered 2015-04-03: 0.5 mL via INTRAMUSCULAR
  Filled 2015-04-03: qty 0.5

## 2015-04-03 NOTE — Discharge Instructions (Signed)
Elbow Contusion °An elbow contusion is a deep bruise of the elbow. Contusions are the result of an injury that caused bleeding under the skin. The contusion may turn blue, purple, or yellow. Minor injuries will give you a painless contusion, but more severe contusions may stay painful and swollen for a few weeks.  °CAUSES  °An elbow contusion comes from a direct force to that area, such as falling on the elbow. °SYMPTOMS  °· Swelling and redness of the elbow. °· Bruising of the elbow area. °· Tenderness or soreness of the elbow. °DIAGNOSIS  °You will have a physical exam and will be asked about your history. You may need an X-ray of your elbow to look for a broken bone (fracture).  °TREATMENT  °A sling or splint may be needed to support your injury. Resting, elevating, and applying cold compresses to the elbow area are often the best treatments for an elbow contusion. Over-the-counter medicines may also be recommended for pain control. °HOME CARE INSTRUCTIONS  °· Put ice on the injured area. °¨ Put ice in a plastic bag. °¨ Place a towel between your skin and the bag. °¨ Leave the ice on for 15-20 minutes, 03-04 times a day. °· Only take over-the-counter or prescription medicines for pain, discomfort, or fever as directed by your caregiver. °· Rest your injured elbow until the pain and swelling are better. °· Elevate your elbow to reduce swelling. °· Apply a compression wrap as directed by your caregiver. This can help reduce swelling and motion. You may remove the wrap for sleeping, showers, and baths. If your fingers become numb, cold, or blue, take the wrap off and reapply it more loosely. °· Use your elbow only as directed by your caregiver. You may be asked to do range of motion exercises. Do them as directed. °· See your caregiver as directed. It is very important to keep all follow-up appointments in order to avoid any long-term problems with your elbow, including chronic pain or inability to move your elbow  normally. °SEEK IMMEDIATE MEDICAL CARE IF:  °· You have increased redness, swelling, or pain in your elbow. °· Your swelling or pain is not relieved with medicines. °· You have swelling of the hand and fingers. °· You are unable to move your fingers or wrist. °· You begin to lose feeling in your hand or fingers. °· Your fingers or hand become cold or blue. °MAKE SURE YOU:  °· Understand these instructions. °· Will watch your condition. °· Will get help right away if you are not doing well or get worse. °  °This information is not intended to replace advice given to you by your health care provider. Make sure you discuss any questions you have with your health care provider. °  °Document Released: 04/11/2006 Document Revised: 07/26/2011 Document Reviewed: 12/16/2014 °Elsevier Interactive Patient Education ©2016 [employer] ° °

## 2015-04-03 NOTE — ED Notes (Signed)
Declined W/C at D/C and was escorted to lobby by RN. 

## 2015-04-03 NOTE — ED Notes (Signed)
Pt in stating she fell this morning on the side walk, hit her left elbow, swelling noted, denies other injuries

## 2015-04-03 NOTE — ED Notes (Signed)
Secondary assessment being completed by PA, at bedside during triage

## 2015-04-03 NOTE — ED Provider Notes (Signed)
CSN: [id number]     Arrival date & time 04/03/15  [id number] History  By signing my name below, I, [name], attest that this documentation has been prepared under the direction and in the presence of non-physician practitioner, Arthor CaptainAbigail Deveon Kisiel, PA-C. Electronically Signed: [name], Scribe. 04/03/2015. 10:12 AM.     Chief Complaint  Patient presents with  . Fall    The history is provided by the patient. No language interpreter was used.    HPI Comments:  Kara Key is a 49 y.o. female with a history of arthritis, who presents to the Emergency Department complaining of 8/10 left elbow pain s/p mechanical fall this am  ~1 hour PTA. Pt states she tripped and landed on her LUE; she describes a tender knot to the site. She denies head injury and LOC.  Tetanus is not UTD. No alleviating factors or associated symptoms noted.  Past Medical History  Diagnosis Date  . Arthritis   . Asthma   . A-fib [facility](HCC)    Past Surgical History  Procedure Laterality Date  . Partial hysterectomy     History reviewed. No pertinent family history. Social History  Substance Use Topics  . Smoking status: Never Smoker   . Smokeless tobacco: None  . Alcohol Use: No   OB History    No data available     Review of Systems  Constitutional: Negative for fever and chills.  Respiratory: Negative for shortness of breath.   Cardiovascular: Negative for chest pain.  Musculoskeletal: Positive for myalgias and arthralgias.    Allergies  Review of patient's allergies indicates no known allergies.  Home Medications   Prior to Admission medications   Medication Sig Start Date End Date Taking? Authorizing Provider  Cinnamon 500 MG capsule Take 500 mg by mouth daily.    Historical Provider, MD  ibuprofen (ADVIL,MOTRIN) 800 MG tablet Take 1 tablet (800 mg total) by mouth every 8 (eight) hours as needed for mild pain. 01/23/15   Ambrose FinlandValerie A Keck, NP  methocarbamol (ROBAXIN) 500 MG tablet Take 1 tablet (500 mg  total) by mouth 2 (two) times daily. 01/10/15   Earley FavorGail Schulz, NP  multivitamin-iron-minerals-folic acid (CENTRUM) chewable tablet Chew 1 tablet by mouth daily.    Historical Provider, MD  predniSONE (DELTASONE) 20 MG tablet 3 Tabs PO Days 1-3, then 2 tabs PO Days 4-6, then 1 tab PO Day 7-9, then Half Tab PO Day 10-12 01/10/15   Earley FavorGail Schulz, NP  psyllium (METAMUCIL) 58.6 % packet Take 1 packet by mouth daily.    Historical Provider, MD  traMADol (ULTRAM) 50 MG tablet Take 1 tablet (50 mg total) by mouth every 8 (eight) hours as needed. 01/23/15   Ambrose FinlandValerie A Keck, NP   BP 114/77 mmHg  Pulse 98  Temp(Src) 98 F (36.7 C) (Oral)  Resp 20  Ht 5\' 3"  (1.6 m)  Wt 293 lb 7 oz (133.102 kg)  BMI 51.99 kg/m2  SpO2 99% Physical Exam  Constitutional: She is oriented to person, place, and time. She appears well-developed and well-nourished. No distress.  HENT:  Head: Normocephalic and atraumatic.  Eyes: Conjunctivae are normal.  Cardiovascular: Normal rate.   Pulmonary/Chest: Effort normal.  Abdominal: She exhibits no distension.  Musculoskeletal:  Tenderness to left olecranon process superior to which is a  3 cm hematoma 3 cm abrasion superior to the hematoma  2+ Left radial pulse  Neurological: She is alert and oriented to person, place, and time.  Skin: Skin is warm and  dry.  Psychiatric: She has a normal mood and affect.  Nursing note and vitals reviewed.   ED Course  Procedures   DIAGNOSTIC STUDIES:  Oxygen Saturation is 99% on RA, normal by my interpretation.    COORDINATION OF CARE:  9:45 AM Will order XR of the LUE. Pt offered tylenol which she has denied at this time. Discussed treatment plan with pt at bedside and pt agreed to plan.  Labs Review Labs Reviewed - No data to display  Imaging Review Dg Elbow Complete Left  04/03/2015  CLINICAL DATA:  Injury this morning, fall EXAM: LEFT ELBOW - COMPLETE 3+ VIEW COMPARISON:  None. FINDINGS: Four views of left elbow submitted. No  acute fracture or subluxation. No posterior fat pad sign. No radiopaque foreign body. IMPRESSION: Negative. Electronically Signed   By: Natasha Mead M.D.   On: 04/03/2015 10:09   I have personally reviewed and evaluated these images as part of my medical decision-making.   EKG Interpretation None      MDM   Final diagnoses:  None    Patient X-Ray negative for obvious fracture or dislocation. Pain managed in ED. Pt advised to follow up with orthopedics if symptoms persist for possibility of missed fracture diagnosis. Patient given brace while in ED, conservative therapy recommended and discussed. Patient will be dc home & is agreeable with above plan.   I personally performed the services described in this documentation, which was scribed in my presence. The recorded information has been reviewed and is accurate.      Arthor Captain, PA-C 04/03/15 1018  Raeford Razor, MD 04/04/15 [phone]

## 2015-05-02 ENCOUNTER — Telehealth: Payer: Self-pay

## 2015-05-02 NOTE — Telephone Encounter (Signed)
Nurse called patient, reached voicemail. Left message for patient to call [facility], at [phone]. Nurse called patient concerning my chart message sent by patient on 01/23/15 concerning a handicap placard.

## 2015-05-06 ENCOUNTER — Telehealth: Payer: Self-pay

## 2015-05-06 NOTE — Telephone Encounter (Signed)
Nurse called patient, reached voicemail. Left message for patient to call [facility]init[facility]mac[facility], at [phone]. Nurse called paitent concerning my chart message sent by patient on 01/23/15 concerning handicap placard.

## 2015-05-06 NOTE — Telephone Encounter (Signed)
Patient called nurse, patient verified date of birth. Patient has not been seen since 01/23/15, patient requesting handicap placard. Patient has issues with her knees. Patient reports she has no insurance and can not go tot orthopedic specialist anymore. Nurse explained need for appointment to be seen for knees and handicap placard. Nurse made patient aware [facility] and [facility] required paperwork for financial application. Patient agrees to make appointment to be seen and to pickup financial application.  Nurse transferred patient to front office to schedule appointment.

## 2015-05-09 NOTE — Telephone Encounter (Signed)
error 

## 2015-09-24 ENCOUNTER — Emergency Department (HOSPITAL_COMMUNITY)
Admission: EM | Admit: 2015-09-24 | Discharge: 2015-09-24 | Disposition: A | Discharge status: Home / Self Care | Payer: Self-pay | Attending: Emergency Medicine | Admitting: Emergency Medicine

## 2015-09-24 ENCOUNTER — Encounter (HOSPITAL_COMMUNITY): Payer: Self-pay | Admitting: Emergency Medicine

## 2015-09-24 DIAGNOSIS — L03311 Cellulitis of abdominal wall: Secondary | ICD-10-CM | POA: Insufficient documentation

## 2015-09-24 DIAGNOSIS — J45909 Unspecified asthma, uncomplicated: Secondary | ICD-10-CM | POA: Insufficient documentation

## 2015-09-24 NOTE — ED Notes (Signed)
Pt sts that she is leaving to no be late for work. sts she is coming back

## 2015-09-24 NOTE — ED Notes (Signed)
Pt reports drainage from her umbilicus since starting to work out. Pt alert x4. NAD at this time.

## 2015-09-25 ENCOUNTER — Emergency Department (HOSPITAL_COMMUNITY)
Admission: EM | Admit: 2015-09-25 | Discharge: 2015-09-25 | Disposition: A | Discharge status: Home / Self Care | Payer: Self-pay | Attending: Emergency Medicine | Admitting: Emergency Medicine

## 2015-09-25 ENCOUNTER — Encounter (HOSPITAL_COMMUNITY): Payer: Self-pay | Admitting: Emergency Medicine

## 2015-09-25 DIAGNOSIS — E669 Obesity, unspecified: Secondary | ICD-10-CM | POA: Insufficient documentation

## 2015-09-25 DIAGNOSIS — J45909 Unspecified asthma, uncomplicated: Secondary | ICD-10-CM | POA: Insufficient documentation

## 2015-09-25 DIAGNOSIS — M1990 Unspecified osteoarthritis, unspecified site: Secondary | ICD-10-CM | POA: Insufficient documentation

## 2015-09-25 DIAGNOSIS — Z79899 Other long term (current) drug therapy: Secondary | ICD-10-CM | POA: Insufficient documentation

## 2015-09-25 DIAGNOSIS — B369 Superficial mycosis, unspecified: Secondary | ICD-10-CM | POA: Insufficient documentation

## 2015-09-25 MED ORDER — NYSTATIN 100000 UNIT/GM EX CREA: TOPICAL_CREAM | CUTANEOUS | Status: DC

## 2015-09-25 NOTE — Discharge Instructions (Signed)
Cutaneous Candidiasis °Cutaneous candidiasis is a condition in which there is an overgrowth of yeast (candida) on the skin. Yeast normally live on the skin, but in small enough numbers not to cause any symptoms. In certain cases, increased growth of the yeast may cause an actual yeast infection. This kind of infection usually occurs in areas of the skin that are constantly warm and moist, such as the armpits or the groin. Yeast is the most common cause of diaper rash in babies and in people who cannot control their bowel movements (incontinence). °CAUSES  °The fungus that most often causes cutaneous candidiasis is Candida albicans. Conditions that can increase the risk of getting a yeast infection of the skin include: °· Obesity. °· Pregnancy. °· Diabetes. °· Taking antibiotic medicine. °· Taking birth control pills. °· Taking steroid medicines. °· Thyroid disease. °· An iron or zinc deficiency. °· Problems with the immune system. °SYMPTOMS  °· Red, swollen area of the skin. °· Bumps on the skin. °· Itchiness. °DIAGNOSIS  °The diagnosis of cutaneous candidiasis is usually based on its appearance. Light scrapings of the skin may also be taken and viewed under a microscope to identify the presence of yeast. °TREATMENT  °Antifungal creams may be applied to the infected skin. In severe cases, oral medicines may be needed.  °HOME CARE INSTRUCTIONS  °· Keep your skin clean and dry. °· Maintain a healthy weight. °· If you have diabetes, keep your blood sugar under control. °SEEK IMMEDIATE MEDICAL CARE IF: °· Your rash continues to spread despite treatment. °· You have a fever, chills, or abdominal pain. °  °This information is not intended to replace advice given to you by your health care provider. Make sure you discuss any questions you have with your health care provider. °  °Document Released: 01/19/2011 Document Revised: 07/26/2011 Document Reviewed: 11/04/2014 °Elsevier Interactive Patient Education ©2016 Elsevier  Inc. ° °

## 2015-09-25 NOTE — ED Provider Notes (Signed)
CSN: [id number]     Arrival date & time 09/25/15  0910 History  By signing my name below, I, Freida Busman, attest that this documentation has been prepared under the direction and in the presence of non-physician practitioner, Everlene Farrier, PA-C. Electronically Signed: Freida Busman, Scribe. 09/25/2015. 9:27 AM.     Chief Complaint  Patient presents with  . navel drainage    The history is provided by the patient. No language interpreter was used.     HPI Comments:  Kara Key is a 50 y.o. female who presents to the Emergency Department complaining of white discharge from her navel x a few days. She reports associated itching and swelling to site and notes discharge has become malodorous. She has applied tea tree oil and warm water with epsom salt without relief. She has also been inserting long Q-tips into her navel to clean it. She denies CP, SOB, abdominal pain, nausea, vomiting, and fever.   Past Medical History  Diagnosis Date  . Arthritis   . Asthma   . A-fib [facility])    Past Surgical History  Procedure Laterality Date  . Partial hysterectomy    . Laparoscopic gastric sleeve resection     No family history on file. Social History  Substance Use Topics  . Smoking status: Never Smoker   . Smokeless tobacco: None  . Alcohol Use: No   OB History    No data available     Review of Systems  Constitutional: Negative for fever and chills.  Respiratory: Negative for shortness of breath.   Cardiovascular: Negative for chest pain.  Gastrointestinal: Negative for nausea, vomiting and abdominal pain.  Skin:       +Discharge from umbilicus      Allergies  Review of patient's allergies indicates no known allergies.  Home Medications   Prior to Admission medications   Medication Sig Start Date End Date Taking? Authorizing Provider  Cinnamon 500 MG capsule Take 500 mg by mouth daily.    Historical Provider, MD  ibuprofen (ADVIL,MOTRIN) 800 MG tablet Take 1 tablet (800  mg total) by mouth every 8 (eight) hours as needed for mild pain. 01/23/15   Ambrose Finland, NP  methocarbamol (ROBAXIN) 500 MG tablet Take 1 tablet (500 mg total) by mouth 2 (two) times daily. 01/10/15   Earley Favor, NP  multivitamin-iron-minerals-folic acid (CENTRUM) chewable tablet Chew 1 tablet by mouth daily.    Historical Provider, MD  nystatin cream (MYCOSTATIN) Apply to affected area 2 times daily 09/25/15   Everlene Farrier, PA-C  predniSONE (DELTASONE) 20 MG tablet 3 Tabs PO Days 1-3, then 2 tabs PO Days 4-6, then 1 tab PO Day 7-9, then Half Tab PO Day 10-12 01/10/15   Earley Favor, NP  psyllium (METAMUCIL) 58.6 % packet Take 1 packet by mouth daily.    Historical Provider, MD  traMADol (ULTRAM) 50 MG tablet Take 1 tablet (50 mg total) by mouth every 8 (eight) hours as needed. 01/23/15   Ambrose Finland, NP   BP 137/87 mmHg  Pulse 73  Temp(Src) 98.4 F (36.9 C) (Oral)  Resp 18  Ht  (1.651 m)  Wt 127.007 kg  BMI 46.59 kg/m2  SpO2 99% Physical Exam  Constitutional: She appears well-developed and well-nourished. No distress.  Obese female. Nontoxic appearing.  HENT:  Head: Normocephalic and atraumatic.  Eyes: Conjunctivae are normal. Pupils are equal, round, and reactive to light. Right eye exhibits no discharge. Left eye exhibits no discharge.  Neck:  Neck supple.  Cardiovascular: Normal rate, regular rhythm, normal heart sounds and intact distal pulses.   Pulmonary/Chest: Effort normal and breath sounds normal. No respiratory distress.  Abdominal: Soft. She exhibits no distension. There is no tenderness. There is no guarding.  Abdomen is soft and nontender to palpation. White discharge from navel. No purulent discharge. No surrounding erythema, edema, fluctuance or induration.  Lymphadenopathy:    She has no cervical adenopathy.  Neurological: She is alert. Coordination normal.  Skin: Skin is warm and dry. No rash noted. She is not diaphoretic. No erythema. No pallor.  Psychiatric:  She has a normal mood and affect. Her behavior is normal.  Nursing note and vitals reviewed.   ED Course  Procedures   DIAGNOSTIC STUDIES:  Oxygen Saturation is 99% on RA, normal by my interpretation.    COORDINATION OF CARE:  9:23 AM Advised pt to keep the area clean and dry and to apply nystatin. Discussed treatment plan with pt at bedside and pt agreed to plan.   MDM   Meds given in ED:  Medications - No data to display  New Prescriptions   NYSTATIN CREAM (MYCOSTATIN)    Apply to affected area 2 times daily    Final diagnoses:  Fungal skin infection   This is a 50 y.o. female who presents to the Emergency Department complaining of white discharge from her navel x a few days. She reports associated itching and swelling to site and notes discharge has become malodorous. On exam the patient is afebrile nontoxic appearing. She is an obese female. She has white discharge from her navel. No purulent discharge. No surrounding erythema, edema, fluctuance or induration. Abdomen is soft and nontender. Patient has fungal versus yeast infection to her umbilicus. We'll discharge with nystatin cream and discussed cleaning instructions. I advised the patient to follow-up with their primary care provider this week. I advised the patient to return to the emergency department with new or worsening symptoms or new concerns. The patient verbalized understanding and agreement with plan.    I personally performed the services described in this documentation, which was scribed in my presence. The recorded information has been reviewed and is accurate.       Everlene FarrierWilliam Zina Pitzer, PA-C 09/25/15 [id number]  Alvira MondayErin Schlossman, MD 09/26/15 1122

## 2015-09-25 NOTE — ED Notes (Signed)
Patient states she has had some drainage from her navel x 2 weeks.  Patient states she has been using :  Tea tree oil, coconut oil, alcohol, peroxide, epsom salts, and antibacterial creams to try and help the area.  Patient states she has some pain in the navel area at times.

## 2015-10-03 ENCOUNTER — Encounter: Payer: Self-pay | Admitting: Family Medicine

## 2015-10-23 ENCOUNTER — Ambulatory Visit (INDEPENDENT_AMBULATORY_CARE_PROVIDER_SITE_OTHER): Payer: Self-pay | Admitting: Family Medicine

## 2015-10-23 ENCOUNTER — Encounter: Payer: Self-pay | Admitting: Family Medicine

## 2015-10-23 DIAGNOSIS — E8881 Metabolic syndrome: Secondary | ICD-10-CM

## 2015-10-23 DIAGNOSIS — J0110 Acute frontal sinusitis, unspecified: Secondary | ICD-10-CM

## 2015-10-23 DIAGNOSIS — M25561 Pain in right knee: Secondary | ICD-10-CM

## 2015-10-23 DIAGNOSIS — M25562 Pain in left knee: Secondary | ICD-10-CM

## 2015-10-23 DIAGNOSIS — G8929 Other chronic pain: Secondary | ICD-10-CM

## 2015-10-23 DIAGNOSIS — M25569 Pain in unspecified knee: Secondary | ICD-10-CM

## 2015-10-23 HISTORY — DX: Morbid (severe) obesity due to excess calories: E66.01

## 2015-10-23 HISTORY — DX: Metabolic syndrome: E88.810

## 2015-10-23 HISTORY — DX: Pain in left knee: M25.562

## 2015-10-23 HISTORY — DX: Acute frontal sinusitis, unspecified: J01.10

## 2015-10-23 HISTORY — DX: Metabolic syndrome: E88.81

## 2015-10-23 HISTORY — DX: Other chronic pain: G89.29

## 2015-10-23 LAB — COMPLETE METABOLIC PANEL WITH GFR
ALT: 13 U/L (ref 6–29)
AST: 16 U/L (ref 10–35)
Albumin: 3.6 g/dL (ref 3.6–5.1)
Alkaline Phosphatase: 85 U/L (ref 33–130)
BILIRUBIN TOTAL: 0.5 mg/dL (ref 0.2–1.2)
BUN: 12 mg/dL (ref 7–25)
CO2: 27 mmol/L (ref 20–31)
CREATININE: 0.81 mg/dL (ref 0.50–1.05)
Calcium: 8.9 mg/dL (ref 8.6–10.4)
Chloride: 102 mmol/L (ref 98–110)
GFR, Est African American: >89 mL/min (ref >=60)
GFR, Est Non African American: 84 mL/min (ref >=60)
GLUCOSE: 75 mg/dL (ref 65–99)
Potassium: 4.1 mmol/L (ref 3.5–5.3)
SODIUM: 140 mmol/L (ref 135–146)
TOTAL PROTEIN: 7.4 g/dL (ref 6.1–8.1)

## 2015-10-23 LAB — CBC WITH DIFFERENTIAL/PLATELET
BASOS PCT: 0 %
Basophils Absolute: 0 cells/uL (ref 0–200)
EOS ABS: 135 {cells}/uL (ref 15–500)
Eosinophils Relative: 3 %
HEMATOCRIT: 42.1 % (ref 35.0–45.0)
Hemoglobin: 13.7 g/dL (ref 11.7–15.5)
LYMPHS PCT: 21 %
Lymphs Abs: 945 cells/uL (ref 850–3900)
MCH: 27.7 pg (ref 27.0–33.0)
MCHC: 32.5 g/dL (ref 32.0–36.0)
MCV: 85.1 fL (ref 80.0–100.0)
MONO ABS: 450 {cells}/uL (ref 200–950)
MPV: 9.6 fL (ref 7.5–12.5)
Monocytes Relative: 10 %
Neutro Abs: 2970 cells/uL (ref 1500–7800)
Neutrophils Relative %: 66 %
Platelets: 289 10*3/uL (ref 140–400)
RBC: 4.95 MIL/uL (ref 3.80–5.10)
RDW: 12.9 % (ref 11.0–15.0)
WBC: 4.5 10*3/uL (ref 3.8–10.8)

## 2015-10-23 LAB — LIPID PANEL
CHOL/HDL RATIO: 4.1 ratio (ref <=5.0)
Cholesterol: 215 mg/dL — ABNORMAL HIGH (ref 125–200)
HDL: 53 mg/dL (ref >=46)
LDL Cholesterol: 126 mg/dL (ref <130)
Triglycerides: 180 mg/dL — ABNORMAL HIGH (ref <150)
VLDL: 36 mg/dL — ABNORMAL HIGH (ref <30)

## 2015-10-23 LAB — HEMOGLOBIN A1C
Hgb A1c MFr Bld: 5.3 % (ref <5.7)
MEAN PLASMA GLUCOSE: 105 mg/dL

## 2015-10-23 LAB — POCT URINALYSIS DIP (DEVICE)
BILIRUBIN URINE: NEGATIVE
GLUCOSE, UA: NEGATIVE mg/dL
Hgb urine dipstick: NEGATIVE
KETONES UR: NEGATIVE mg/dL
LEUKOCYTES UA: NEGATIVE
Nitrite: NEGATIVE
Protein, ur: NEGATIVE mg/dL
SPECIFIC GRAVITY, URINE: 1.015 (ref 1.005–1.030)
Urobilinogen, UA: 0.2 mg/dL (ref 0.0–1.0)
pH: 6 (ref 5.0–8.0)

## 2015-10-23 LAB — TSH: TSH: 3.86 mIU/L

## 2015-10-23 MED ORDER — KETOROLAC TROMETHAMINE 60 MG/2ML IM SOLN
60.0000 mg | Freq: Once | INTRAMUSCULAR | Status: AC
  Administered 2015-10-23: 60 mg via INTRAMUSCULAR

## 2015-10-23 MED ORDER — AMOXICILLIN-POT CLAVULANATE 875-125 MG PO TABS: 1.0000 | ORAL_TABLET | Freq: Two times a day (BID) | ORAL | Status: DC

## 2015-10-23 MED ORDER — LEVOCETIRIZINE DIHYDROCHLORIDE 5 MG PO TABS: 5.0000 mg | ORAL_TABLET | Freq: Every evening | ORAL | Status: DC

## 2015-10-23 MED ORDER — TRAMADOL HCL 50 MG PO TABS: 50.0000 mg | ORAL_TABLET | Freq: Three times a day (TID) | ORAL | Status: DC | PRN

## 2015-10-23 MED ORDER — MELOXICAM 7.5 MG PO TABS: 7.5000 mg | ORAL_TABLET | Freq: Every day | ORAL | Status: DC

## 2015-10-23 MED FILL — AMOX-CLAV 875-125 MG TABLET: 875-125 | 10 days supply | Qty: 20 | Fill #0

## 2015-10-23 MED FILL — MELOXICAM 7.5 MG TABLET: 7.5 | 30 days supply | Qty: 30 | Fill #0

## 2015-10-23 NOTE — Patient Instructions (Signed)
Diet for Metabolic Syndrome Metabolic syndrome is a disorder that includes at least three of these conditions:  Abdominal obesity.  Too much sugar in your blood.  High blood pressure.  Higher than normal amount of fat (lipids) in your blood.  Lower than normal level of "good" cholesterol (HDL). Following a healthy diet can help to keep metabolic syndrome under control. It can also help to prevent the development of conditions that are associated with metabolic syndrome, such as diabetes, heart disease, and stroke. Along with exercise, a healthy diet:  Helps to improve the way that the body uses insulin.  Promotes weight loss. A common goal for people with this condition is to lose at least 7 to 10 percent of their starting weight. WHAT DO I NEED TO KNOW ABOUT THIS DIET?  Use the glycemic index (GI) to plan your meals. The index tells you how quickly a food will raise your blood sugar. Choose foods that have low GI values. These foods take a longer time to raise blood sugar.  Keep track of how many calories you take in. Eating the right amount of calories will help your achieve a healthy weight.  You may want to follow a Mediterranean diet. This diet includes lots of vegetables, lean meats or fish, whole grains, fruits, and healthy oils and fats. WHAT FOODS CAN I EAT? Grains Stone-ground whole wheat. Pumpernickel bread. Whole-grain bread, crackers, tortillas, cereal, and pasta. Unsweetened oatmeal.Bulgur.Barley.Quinoa.Brown rice or wild rice. Vegetables Lettuce. Spinach. Peas. Beets. Cauliflower. Cabbage. Broccoli. Carrots. Tomatoes. Squash. Eggplant. Herbs. Peppers. Onions. Cucumbers. Brussels sprouts. Sweet potatoes. Yams. Beans. Lentils. Fruits Berries. Apples. Oranges. Grapes. Mango. Pomegranate. Kiwi. Cherries. Meats and Other Protein Sources Seafood and shellfish. Lean meats.Poultry. Tofu. Dairy Low-fat or fat-free dairy products, such as milk, yogurt, and  cheese. Beverages Water. Low-fat milk. Milk alternatives, like soy milk or almond milk. Real fruit juice. Condiments Low-sugar or sugar-free ketchup, barbecue sauce, and mayonnaise. Mustard. Relish. Fats and Oils Avocado. Canola or olive oil. Nuts and nut butters.Seeds. The items listed above may not be a complete list of recommended foods or beverages. Contact your dietitian for more options.  WHAT FOODS ARE NOT RECOMMENDED? Red meat. Palm oil and coconut oil. Processed foods. Fried foods. Alcohol. Sweetened drinks, such as iced tea and soda. Sweets. Salty foods. The items listed above may not be a complete list of foods and beverages to avoid. Contact your dietitian for more information.   This information is not intended to replace advice given to you by your health care provider. Make sure you discuss any questions you have with your health care provider.   Document Released: 09/17/2014 Document Reviewed: 09/17/2014 Elsevier Interactive Patient Education 2016 Elsevier Inc.  

## 2015-10-23 NOTE — Progress Notes (Signed)
Subjective:    Patient ID: Kara Key, female    DOB: 12/16/1965, 50 y.o.   MRN: [id number]  HPI  Kara Key, a 50 year old female with a history of bilateral knee pain presents to establish care. She was a patient of Kara CommonsValerie Keck, NP at [facility], but has been lost to follow-up. She is current complaining of bilateral knee pain for greater than 1 year. Current symptoms include stiffness and swelling. Pain is aggravated by any weight bearing, going up and down stairs, inactivity and lateral movements.  Patient has had prior knee problems. She says that pain has been controlled on Tramadol 50 mg every 8 hours. She says that she has been out of medications recently. She has take OTC Aleve with unsatisfactory relief.    Patient presents with chronic sinusitis.  There has not been a history of sinusitis. She complains of a sinus headache, sore throat, nasal congestion, facial pain, and post nasal drip for the past 2 weeks. She has tried OTC sinus tabs with minimal relief. She denies prior antibiotic use.  Past Medical History  Diagnosis Date  . Arthritis   . Asthma   . A-fib (HCC)    Immunization History  Administered Date(s) Administered  . Tdap 04/03/2015   Social History   Social History  . Marital Status: Single    Spouse Name: N/A  . Number of Children: N/A  . Years of Education: N/A   Occupational History  . Not on file.   Social History Main Topics  . Smoking status: Never Smoker   . Smokeless tobacco: Not on file  . Alcohol Use: Yes     Comment: occ  . Drug Use: No  . Sexual Activity: Not on file   Other Topics Concern  . Not on file   Social History Narrative   Review of Systems  Constitutional: Positive for unexpected weight change (Weight gain).  HENT: Positive for congestion, postnasal drip and sinus pressure.   Eyes: Negative.   Respiratory: Negative.   Cardiovascular: Negative.  Negative for chest pain, palpitations and leg  swelling.  Gastrointestinal: Negative.  Negative for constipation.  Endocrine: Negative.  Negative for cold intolerance, polydipsia and polyphagia.  Genitourinary: Negative.   Musculoskeletal: Positive for myalgias and arthralgias (bilateral knee pain).  Skin: Negative.   Allergic/Immunologic: Negative.  Negative for immunocompromised state.  Neurological: Negative.   Hematological: Negative.   Psychiatric/Behavioral: Negative.  Negative for suicidal ideas and sleep disturbance.       Objective:   Physical Exam  Constitutional: She is oriented to person, place, and time.  HENT:  Nose: Mucosal edema present. Right sinus exhibits frontal sinus tenderness. Left sinus exhibits frontal sinus tenderness.  Mouth/Throat: Oropharyngeal exudate and posterior oropharyngeal erythema present.  Eyes: Conjunctivae and EOM are normal. Pupils are equal, round, and reactive to light.  Neck: Normal range of motion. Neck supple.  Cardiovascular: Normal rate, normal heart sounds and intact distal pulses.   Pulmonary/Chest: Effort normal and breath sounds normal.  Abdominal: Soft. Bowel sounds are normal.  Neurological: She is alert and oriented to person, place, and time. She has normal reflexes.  Skin: Skin is warm and dry.  Psychiatric: She has a normal mood and affect. Her behavior is normal. Judgment and thought content normal.      BP 116/56 mmHg  Pulse 85  Temp(Src) 98.1 F (36.7 C) (Oral)  Resp 16  Ht 5\' 5"  (1.651 m)  Wt 300 lb (136.079  kg)  BMI 49.92 kg/m2  SpO2 100% Assessment & Plan:  1. Knee pain, acute, unspecified laterality Discussed weight loss at length to assist in alleviating bilateral knee pain. Will warrant an orthopedic referral for further evaluation and management.  - traMADol (ULTRAM) 50 MG tablet; Take 1 tablet (50 mg total) by mouth every 8 (eight) hours as needed.  Dispense: 30 tablet; Refill: 0 - meloxicam (MOBIC) 7.5 MG tablet; Take 1 tablet (7.5 mg total) by mouth  daily.  Dispense: 30 tablet; Refill: 2 - ketorolac (TORADOL) injection 60 mg; Inject 2 mLs (60 mg total) into the muscle once.  2. Acute frontal sinusitis, recurrence not specified - amoxicillin-clavulanate (AUGMENTIN) 875-125 MG tablet; Take 1 tablet by mouth 2 (two) times daily.  Dispense: 20 tablet; Refill: 0 - levocetirizine (XYZAL) 5 MG tablet; Take 1 tablet (5 mg total) by mouth every evening.  Dispense: 30 tablet; Refill: 1  3. Morbid obesity, unspecified obesity type (HCC) Recommend a lowfat, low carbohydrate diet divided over 5-6 small meals, increase water intake to 6-8 glasses, and 150 minutes per week of cardiovascular exercise.   - Biotin 5000 MCG CAPS; Take 1 capsule by mouth 1 day or 1 dose. - Cholecalciferol (VITAMIN D3) 1000 units CAPS; Take by mouth. - Cyanocobalamin (VITAMIN B 12 PO); Take by mouth. - Hemoglobin A1c - Urinalysis Dipstick - COMPLETE METABOLIC PANEL WITH GFR - TSH - CBC with Differential - Lipid Panel - POCT urinalysis dip (device)  4. Metabolic syndrome  - Hemoglobin A1c - Urinalysis Dipstick - COMPLETE METABOLIC PANEL WITH GFR - TSH - Lipid Panel - POCT urinalysis dip (device)   Routine Health Maintenance:  Will return to clinic in 1 month for a pap smear Recommend a mammogram, last mammogram 3 years ago   RTC: 1 month for pap smear  Massie Maroon, FNP

## 2015-10-24 ENCOUNTER — Other Ambulatory Visit: Payer: Self-pay | Admitting: Family Medicine

## 2015-10-24 DIAGNOSIS — E785 Hyperlipidemia, unspecified: Secondary | ICD-10-CM

## 2015-10-24 HISTORY — DX: Hyperlipidemia, unspecified: E78.5

## 2015-10-24 MED ORDER — ATORVASTATIN CALCIUM 20 MG PO TABS: 20.0000 mg | ORAL_TABLET | Freq: Every day | ORAL | Status: DC

## 2015-10-24 MED FILL — ATORVASTATIN 20 MG TABLET: 20 | 30 days supply | Qty: 30 | Fill #0

## 2015-10-24 NOTE — Progress Notes (Signed)
Called and spoke with patient, advised of labs and the need to start lipitor 20mg  daily with dinner. Patient verbalized understanding and states she will start medication as prescribed and start diet and exercise and we will see her at next visit. Thanks!

## 2015-10-24 NOTE — Progress Notes (Signed)
Called, no answer. Left message for patient to return call. Thanks!  

## 2015-11-24 MED FILL — ATORVASTATIN 20 MG TABLET: 20 | 30 days supply | Qty: 30 | Fill #1

## 2015-12-11 ENCOUNTER — Ambulatory Visit: Payer: Self-pay | Admitting: Family Medicine

## 2016-01-15 ENCOUNTER — Ambulatory Visit (INDEPENDENT_AMBULATORY_CARE_PROVIDER_SITE_OTHER): Payer: Self-pay | Admitting: Family Medicine

## 2016-01-15 ENCOUNTER — Encounter: Payer: Self-pay | Admitting: Family Medicine

## 2016-01-15 MED ORDER — NAPROXEN 500 MG PO TABS: 500.0000 mg | ORAL_TABLET | Freq: Two times a day (BID) | ORAL | 2 refills | Status: DC

## 2016-01-20 MED FILL — ?ATORVASTATIN 20 MG TABLET: 20 | 30 days supply | Qty: 30 | Fill #2

## 2016-02-02 MED FILL — NAPROXEN 500 MG TABLET: 500 | 15 days supply | Qty: 30 | Fill #0

## 2016-04-16 NOTE — Progress Notes (Signed)
No show

## 2016-04-26 MED FILL — ATORVASTATIN 20 MG TABLET: 20 | 30 days supply | Qty: 30 | Fill #3

## 2016-05-05 ENCOUNTER — Other Ambulatory Visit: Payer: Self-pay | Admitting: Family Medicine

## 2016-05-05 ENCOUNTER — Encounter (HOSPITAL_COMMUNITY): Payer: Self-pay | Admitting: Neurology

## 2016-05-05 ENCOUNTER — Emergency Department (HOSPITAL_COMMUNITY)
Admission: EM | Admit: 2016-05-05 | Discharge: 2016-05-05 | Disposition: A | Discharge status: Home / Self Care | Payer: Self-pay | Attending: Emergency Medicine | Admitting: Emergency Medicine

## 2016-05-05 ENCOUNTER — Emergency Department (HOSPITAL_COMMUNITY): Payer: Self-pay

## 2016-05-05 DIAGNOSIS — M25569 Pain in unspecified knee: Secondary | ICD-10-CM

## 2016-05-05 DIAGNOSIS — Y9389 Activity, other specified: Secondary | ICD-10-CM | POA: Insufficient documentation

## 2016-05-05 DIAGNOSIS — J45909 Unspecified asthma, uncomplicated: Secondary | ICD-10-CM | POA: Insufficient documentation

## 2016-05-05 DIAGNOSIS — Y9289 Other specified places as the place of occurrence of the external cause: Secondary | ICD-10-CM | POA: Insufficient documentation

## 2016-05-05 DIAGNOSIS — X509XXA Other and unspecified overexertion or strenuous movements or postures, initial encounter: Secondary | ICD-10-CM | POA: Insufficient documentation

## 2016-05-05 DIAGNOSIS — Y990 Civilian activity done for income or pay: Secondary | ICD-10-CM | POA: Insufficient documentation

## 2016-05-05 DIAGNOSIS — M79674 Pain in right toe(s): Secondary | ICD-10-CM | POA: Insufficient documentation

## 2016-05-05 MED ORDER — IBUPROFEN 400 MG PO TABS
600.0000 mg | ORAL_TABLET | Freq: Once | ORAL | Status: AC
  Administered 2016-05-05: 600 mg via ORAL
  Filled 2016-05-05: qty 1

## 2016-05-05 MED ORDER — IBUPROFEN 800 MG PO TABS: 800.0000 mg | ORAL_TABLET | Freq: Three times a day (TID) | ORAL | 0 refills | Status: DC

## 2016-05-05 NOTE — ED Triage Notes (Signed)
Pt here with right 5th toe pain for several days and swelling. No hx of DM.

## 2016-05-05 NOTE — ED Provider Notes (Signed)
[facility]-EMERGENCY DEPT Provider Note   CSN: [id number] Arrival date & time: 05/05/16  1034  By signing my name below, I, Emmanuella Mensah, attest that this documentation has been prepared [id number] the direction and in the presence of [facility] LLCJaime Lamone Ferrelli, PA-C. Electronically Signed: Angelene GiovanniEmmanuella Mensah, ED Scribe. 05/05/16. 11:17 AM.   History   Chief Complaint Chief Complaint  Patient presents with  . Toe Pain    HPI Comments: Kara Key is a 50 y.o. female who presents to the Emergency Department complaining of gradually worsening moderate pain and swelling to her right 5th toe (worse to the lateral aspect) onset 2-3 days ago. She reports associated pain with ROM of the toe. She denies any known falls, injuries, or trauma but reports that she is on her feet a lot while working and it is very possible that she hit it. No alleviating factors noted. Pt has tried OTC pain medication and epsom salt with no relief. She has NKDA. She denies any fever, chills, nausea, vomiting, numbness/tingling, open wounds, or any other symptoms.   The history is provided by the patient. No language interpreter was used.    Past Medical History:  Diagnosis Date  . A-fib (HCC)   . Arthritis   . Asthma     Patient Active Problem List   Diagnosis Date Noted  . Hyperlipidemia 10/24/2015  . Morbid obesity (HCC) 10/23/2015  . Knee pain, acute 10/23/2015  . Acute frontal sinusitis 10/23/2015  . Metabolic syndrome 10/23/2015    Past Surgical History:  Procedure Laterality Date  . LAPAROSCOPIC GASTRIC SLEEVE RESECTION    . PARTIAL HYSTERECTOMY      OB History    No data available       Home Medications    Prior to Admission medications   Medication Sig Start Date End Date Taking? Authorizing Provider  amoxicillin-clavulanate (AUGMENTIN) 875-125 MG tablet Take 1 tablet by mouth 2 (two) times daily. Patient not taking: Reported on 01/15/2016 10/23/15   Massie MaroonLachina M Hollis, FNP  atorvastatin (LIPITOR) 20 MG  tablet Take 1 tablet (20 mg total) by mouth daily. 10/24/15   Massie MaroonLachina M Hollis, FNP  Biotin 5000 MCG CAPS Take 1 capsule by mouth 1 day or 1 dose.    Historical Provider, MD  Cholecalciferol (VITAMIN D3) 1000 units CAPS Take by mouth.    Historical Provider, MD  Cyanocobalamin (VITAMIN B 12 PO) Take by mouth.    Historical Provider, MD  ibuprofen (ADVIL,MOTRIN) 800 MG tablet Take 1 tablet (800 mg total) by mouth every 8 (eight) hours as needed for mild pain. Patient not taking: Reported on 01/15/2016 01/23/15   Ambrose FinlandValerie A Keck, NP  levocetirizine (XYZAL) 5 MG tablet Take 1 tablet (5 mg total) by mouth every evening. 10/23/15   Massie MaroonLachina M Hollis, FNP  multivitamin-iron-minerals-folic acid (CENTRUM) chewable tablet Chew 1 tablet by mouth daily.    Historical Provider, MD  naproxen (NAPROSYN) 500 MG tablet Take 1 tablet (500 mg total) by mouth 2 (two) times daily with a meal. 01/15/16   Henrietta HooverLinda C Bernhardt, NP  psyllium (METAMUCIL) 58.6 % packet Take 1 packet by mouth daily.    Historical Provider, MD  traMADol (ULTRAM) 50 MG tablet Take 1 tablet (50 mg total) by mouth every 8 (eight) hours as needed. 10/23/15   Massie MaroonLachina M Hollis, FNP    Family History No family history on file.  Social History Social History  Substance Use Topics  . Smoking status: Never Smoker  . Smokeless tobacco: Never Used  .  Alcohol use Yes     Comment: occ     Allergies   Patient has no known allergies.   Review of Systems Review of Systems  Constitutional: Negative for chills and fever.  Gastrointestinal: Negative for nausea and vomiting.  Musculoskeletal: Positive for arthralgias and joint swelling.  Skin: Negative for wound.  Neurological: Negative for numbness.     Physical Exam Updated Vital Signs BP 115/89 (BP Location: Right Arm)   Pulse 82   Temp 97.6 F (36.4 C) (Oral)   Resp 18   Ht 5\' 5"  (1.651 m)   Wt 290 lb (131.5 kg)   SpO2 100%   BMI 48.26 kg/m   Physical Exam  Constitutional: She is oriented  to person, place, and time. She appears well-developed and well-nourished. No distress.  HENT:  Head: Normocephalic and atraumatic.  Cardiovascular: Normal rate, regular rhythm and normal heart sounds.   No murmur heard. Pulmonary/Chest: Effort normal and breath sounds normal. No respiratory distress.  Musculoskeletal: She exhibits tenderness.  Right 5th toe, moderately swollen, TTP but no warmth or erythema. No TTP of forefoot or 5th metatarsal. 2+ DP and sensation intact   Neurological: She is alert and oriented to person, place, and time.  Skin: Skin is warm and dry.  Nursing note and vitals reviewed.    ED Treatments / Results  DIAGNOSTIC STUDIES: Oxygen Saturation is 100% on RA, normal by my interpretation.    COORDINATION OF CARE: 11:16 AM- Pt advised of plan for treatment and pt agrees. Pt will receive right foot x-ray for further evaluation.    Labs (all labs ordered are listed, but only abnormal results are displayed) Labs Reviewed - No data to display  EKG  EKG Interpretation None       Radiology No results found.  Procedures Procedures (including critical care time)  Medications Ordered in ED Medications - No data to display   Initial Impression / Assessment and Plan / ED Course  Elizabeth SauerJaime Brennah Quraishi, PA-C has reviewed the triage vital signs and the nursing notes.  Pertinent labs & imaging results that were available during my care of the patient were reviewed by me and considered in my medical decision making (see chart for details).  Clinical Course    Kara Key is a 50 y.o. female who presents to ED for right 5th toe pain. Patient X-Ray negative for obvious fracture or dislocation. Pain managed in ED. Post-op shoe provided. Rx for ibuprofen give. Home care instructions discussed. All questions answered.   Final Clinical Impressions(s) / ED Diagnoses   Final diagnoses:  None    New Prescriptions New Prescriptions   No medications on file   I  personally performed the services described in this documentation, which was scribed in my presence. The recorded information has been reviewed and is accurate.    [facility] LLCJaime Pilcher Ryder Chesmore, PA-C 05/05/16 1307    Jerelyn ScottMartha Linker, MD 05/05/16 1310

## 2016-05-05 NOTE — Discharge Instructions (Signed)
Ibuprofen as needed for pain.  Ice and elevated for additional pain relief.  If symptoms are not improving by Monday, please follow up with your primary care provider.  Return to ER for new or worsening symptoms, any additional concerns.

## 2016-05-05 NOTE — ED Notes (Signed)
Pt transported to xray 

## 2016-05-19 ENCOUNTER — Ambulatory Visit: Payer: Self-pay | Admitting: Family Medicine

## 2016-05-24 ENCOUNTER — Other Ambulatory Visit: Payer: Self-pay | Admitting: Family Medicine

## 2016-05-24 MED ORDER — TRAMADOL HCL 50 MG PO TABS: 50.0000 mg | ORAL_TABLET | Freq: Three times a day (TID) | ORAL | 0 refills | Status: DC | PRN

## 2016-07-12 ENCOUNTER — Encounter: Payer: Self-pay | Admitting: Family Medicine

## 2016-07-14 ENCOUNTER — Other Ambulatory Visit: Payer: Self-pay | Admitting: Family Medicine

## 2016-07-15 ENCOUNTER — Ambulatory Visit: Payer: Self-pay | Admitting: Family Medicine

## 2016-07-22 ENCOUNTER — Other Ambulatory Visit: Payer: Self-pay | Admitting: Family Medicine

## 2016-07-22 ENCOUNTER — Ambulatory Visit (INDEPENDENT_AMBULATORY_CARE_PROVIDER_SITE_OTHER): Payer: Self-pay | Admitting: Family Medicine

## 2016-07-22 ENCOUNTER — Encounter: Payer: Self-pay | Admitting: Family Medicine

## 2016-07-22 VITALS — BP 154/79 | HR 81 | Temp 97.9°F | Resp 18 | Ht 65.0 in | Wt 293.8 lb

## 2016-07-22 DIAGNOSIS — Z114 Encounter for screening for human immunodeficiency virus [HIV]: Secondary | ICD-10-CM

## 2016-07-22 DIAGNOSIS — G8929 Other chronic pain: Secondary | ICD-10-CM

## 2016-07-22 DIAGNOSIS — I10 Essential (primary) hypertension: Secondary | ICD-10-CM

## 2016-07-22 DIAGNOSIS — M25562 Pain in left knee: Secondary | ICD-10-CM

## 2016-07-22 DIAGNOSIS — M25561 Pain in right knee: Secondary | ICD-10-CM

## 2016-07-22 DIAGNOSIS — N949 Unspecified condition associated with female genital organs and menstrual cycle: Secondary | ICD-10-CM

## 2016-07-22 DIAGNOSIS — Z1159 Encounter for screening for other viral diseases: Secondary | ICD-10-CM

## 2016-07-22 HISTORY — DX: Essential (primary) hypertension: I10

## 2016-07-22 MED ORDER — MELOXICAM 7.5 MG PO TABS: 7.5000 mg | ORAL_TABLET | Freq: Every day | ORAL | 0 refills | Status: DC

## 2016-07-22 MED ORDER — HYDROCHLOROTHIAZIDE 12.5 MG PO TABS
12.5000 mg | ORAL_TABLET | Freq: Every day | ORAL | 0 refills | Status: DC
Start: 2016-07-22 — End: 2016-10-12

## 2016-07-22 MED ORDER — TRAMADOL HCL 50 MG PO TABS: 50.0000 mg | ORAL_TABLET | Freq: Three times a day (TID) | ORAL | 0 refills | Status: DC | PRN

## 2016-07-22 MED ORDER — KETOROLAC TROMETHAMINE 60 MG/2ML IM SOLN
60.0000 mg | Freq: Once | INTRAMUSCULAR | Status: AC
  Administered 2016-07-22: 60 mg via INTRAMUSCULAR

## 2016-07-22 MED FILL — MELOXICAM 7.5 MG TABLET: 7.5 | 30 days supply | Qty: 30 | Fill #0

## 2016-07-22 MED FILL — HYDROCHLOROTHIAZIDE 12.5 MG: 12.5 | 30 days supply | Qty: 30 | Fill #0

## 2016-07-22 NOTE — Progress Notes (Signed)
Subjective:    Patient ID: Kara Key, female    DOB: May 04, 1966, 51 y.o.   MRN: [id number]  HPI  Ms. Kara Key, a 51 year old female with a history of bilateral knee, morbid obesity and hypertension.  She is current complaining of bilateral knee pain for greater than 1 year. Current symptoms include stiffness and swelling. Pain is aggravated by any weight bearing, going up and down stairs, inactivity and lateral movements.  Patient has had prior knee problems. She says that pain has been controlled in the past on Tramadol 50 mg every 8 hours. She says that she has been out of medications recently. Current pain intensity is 8/10. Pain is worsened with ambulation.  She has take OTC Aleve with unsatisfactory relief.   She is also complaining of a recurrent vaginal lesion. She is not sexually active. She denies vaginal itching and/or vaginal discharge. She describes lesion as tender to touch and nondraining.   Past Medical History:  Diagnosis Date  . A-fib (HCC)   . Arthritis   . Asthma    Immunization History  Administered Date(s) Administered  . Tdap 04/03/2015   Social History   Social History  . Marital status: Single    Spouse name: N/A  . Number of children: N/A  . Years of education: N/A   Occupational History  . Not on file.   Social History Main Topics  . Smoking status: Never Smoker  . Smokeless tobacco: Never Used  . Alcohol use Yes     Comment: occ  . Drug use: No  . Sexual activity: Not on file   Other Topics Concern  . Not on file   Social History Narrative  . No narrative on file   Review of Systems  Constitutional: Positive for unexpected weight change (Weight gain).  Eyes: Negative.   Respiratory: Negative.   Cardiovascular: Negative.  Negative for chest pain, palpitations and leg swelling.  Gastrointestinal: Negative.  Negative for constipation and vomiting.  Endocrine: Negative.  Negative for cold intolerance, polydipsia and polyphagia.    Genitourinary: Negative.        Genital lesion  Musculoskeletal: Positive for arthralgias (bilateral knee pain) and myalgias.  Skin: Negative.   Allergic/Immunologic: Negative.   Neurological: Negative.   Hematological: Negative.   Psychiatric/Behavioral: Negative.  Negative for sleep disturbance and suicidal ideas.       Objective:   Physical Exam  Constitutional: She is oriented to person, place, and time.  Eyes: Conjunctivae and EOM are normal. Pupils are equal, round, and reactive to light.  Neck: Normal range of motion. Neck supple.  Cardiovascular: Normal rate, normal heart sounds and intact distal pulses.   Pulmonary/Chest: Effort normal and breath sounds normal.  Abdominal: Soft. Bowel sounds are normal.  Genitourinary:     Neurological: She is alert and oriented to person, place, and time. She has normal reflexes.  Skin: Skin is warm and dry.  Psychiatric: She has a normal mood and affect. Her behavior is normal. Judgment and thought content normal.      BP (!) 154/79 (BP Location: Left Arm, Patient Position: Sitting, Cuff Size: Large)   Pulse 81   Temp 97.9 F (36.6 C) (Oral)   Resp 18   Ht 5\' 5"  (1.651 m)   Wt 293 lb 12.8 oz (133.3 kg)   BMI 48.89 kg/m  Assessment & Plan:  1. Chronic pain of both knees - traMADol (ULTRAM) 50 MG tablet; Take 1 tablet (50 mg total) by  mouth every 8 (eight) hours as needed.  Dispense: 30 tablet; Refill: 0 - meloxicam (MOBIC) 7.5 MG tablet; Take 1 tablet (7.5 mg total) by mouth daily.  Dispense: 30 tablet; Refill: 0 - ketorolac (TORADOL) injection 60 mg; Inject 2 mLs (60 mg total) into the muscle once. - Sedimentation Rate  2. Essential hypertension Will start thiazide diuretic today. Will follow up in 1 month for hypertension. The patient is asked to make an attempt to improve diet and exercise patterns to aid in medical management of this problem. - hydrochlorothiazide (HYDRODIURIL) 12.5 MG tablet; Take 1 tablet (12.5 mg  total) by mouth daily.  Dispense: 90 tablet; Refill: 0 - Basic Metabolic Panel  3. Genital lesion, female  - HSV(herpes simplex vrs) 1+2 ab-IgG  4. Screening for HIV (human immunodeficiency virus) - HIV antibody (with reflex)  5. Need for hepatitis C screening test - Hepatitis C antibody, reflex - TSH - Lipid Panel - POCT urinalysis dip (device)   Routine Health Maintenance:  Will return to clinic in 1 month for a pap smear Recommend a mammogram, last mammogram 3 years ago   RTC: 1 month for hypertension  Massie MaroonHollis,Jaeanna Mccomber M, FNP

## 2016-07-23 ENCOUNTER — Ambulatory Visit: Payer: Self-pay | Attending: Family Medicine

## 2016-07-23 LAB — HSV(HERPES SIMPLEX VRS) I + II AB-IGG

## 2016-07-23 LAB — HEPATITIS C ANTIBODY: HCV AB: NEGATIVE

## 2016-07-23 LAB — HIV ANTIBODY (ROUTINE TESTING W REFLEX): HIV: NONREACTIVE

## 2016-07-23 LAB — BASIC METABOLIC PANEL
BUN: 12 mg/dL (ref 7–25)
CALCIUM: 9 mg/dL (ref 8.6–10.4)
CO2: 29 mmol/L (ref 20–31)
Chloride: 103 mmol/L (ref 98–110)
Creat: 0.84 mg/dL (ref 0.50–1.05)
Glucose, Bld: 59 mg/dL — ABNORMAL LOW (ref 65–99)
Potassium: 4 mmol/L (ref 3.5–5.3)
SODIUM: 143 mmol/L (ref 135–146)

## 2016-07-23 LAB — SEDIMENTATION RATE: SED RATE: 18 mm/h (ref 0–30)

## 2016-07-27 ENCOUNTER — Ambulatory Visit: Payer: Self-pay | Admitting: Family Medicine

## 2016-08-26 ENCOUNTER — Encounter: Payer: Self-pay | Admitting: Family Medicine

## 2016-08-26 ENCOUNTER — Ambulatory Visit (INDEPENDENT_AMBULATORY_CARE_PROVIDER_SITE_OTHER): Payer: Self-pay | Admitting: Family Medicine

## 2016-08-26 VITALS — BP 102/58 | HR 83 | Temp 97.3°F | Resp 16 | Ht 65.0 in | Wt 289.0 lb

## 2016-08-26 DIAGNOSIS — G8929 Other chronic pain: Secondary | ICD-10-CM

## 2016-08-26 DIAGNOSIS — Z9109 Other allergy status, other than to drugs and biological substances: Secondary | ICD-10-CM

## 2016-08-26 DIAGNOSIS — M25561 Pain in right knee: Secondary | ICD-10-CM

## 2016-08-26 DIAGNOSIS — I10 Essential (primary) hypertension: Secondary | ICD-10-CM

## 2016-08-26 DIAGNOSIS — M25562 Pain in left knee: Secondary | ICD-10-CM

## 2016-08-26 MED ORDER — MELOXICAM 7.5 MG PO TABS
7.5000 mg | ORAL_TABLET | Freq: Every day | ORAL | 1 refills | Status: DC
Start: 2016-08-26 — End: 2016-11-25

## 2016-08-26 MED ORDER — TRAMADOL HCL 50 MG PO TABS: 50.0000 mg | ORAL_TABLET | Freq: Three times a day (TID) | ORAL | 0 refills | Status: DC | PRN

## 2016-08-26 MED ORDER — LEVOCETIRIZINE DIHYDROCHLORIDE 5 MG PO TABS: 5.0000 mg | ORAL_TABLET | Freq: Every evening | ORAL | 5 refills | Status: DC

## 2016-08-26 MED FILL — MELOXICAM 7.5 MG TABLET: 7.5 | 30 days supply | Qty: 30 | Fill #0

## 2016-08-26 NOTE — Patient Instructions (Addendum)
Coronary Artery Disease Treatment Decision Aid Introduction If you have coronary artery disease, you may have several treatment options. This document will review three of those options.  What are my treatment options?  Stenting  Medicines can treat and prevent heart disease. Taking medicines may be referred to as Optimal Medical Therapy.: This treatment involves procedures to widen blocked arteries by placing a piece of metal that looks like a coil or spring (stent) into blocked arteries. A small tube (catheter) is inserted into an artery in your groin, your wrist, or the fold of your arm. The catheter is used to guide the stent into place. This treatment is also called angioplasty or angioplasty with stent. Medicines are also needed.  Heart Bypass Surgery  Medicines can treat and prevent heart disease. Taking medicines may be referred to as Optimal Medical Therapy.: This treatment involves surgery to allow blood to flow past (bypass) blocked coronary arteries. A section of a blood vessel from another part of the body (usually the leg, arm, or chest) is removed and then inserted into the affected vessel. Like a bypass on a blocked road, this allows blood to bypass the blocked part of the coronary artery. This surgery may be done through a large incision in the front of the chest (open technique) or through an incision over the left chest area (minimally invasive technique). Medicines are also needed. Who is eligible?  Good candidates may include:  Stenting  People with stable ischemic heart disease.: People who have tried medicine-only treatment but continue to have chest pain (angina) that affects quality of life.  People with coronary stenosis that is not severe, is not in a high-risk area, or supplies only a small area of the heart.: People who have experienced unacceptable side effects of medicines.  : People with severe or complete coronary artery blockage.  : People who have survived  a sudden cardiac arrest and have a severe blockage in at least one coronary artery.  :  :  Heart Bypass Surgery  People with stable ischemic heart disease.: People who have tried medicine-only treatment but continue to have angina that affects quality of life.  People with coronary stenosis that is not severe, is not in a high-risk area, or supplies only a small area of the heart.: People who have experienced unacceptable side effects of medicines.  : People with severe or complete coronary artery blockage.  : People who have survived a sudden cardiac arrest and have a severe blockage in at least one coronary artery.  : People with multiple coronary artery blockages.  : People with diabetes. People who have diabetes and multiple blockages may have better long-term success with surgery instead of stenting. What are the benefits?  Benefits may include:  Stenting  Prevention of heart attack and death.: Prevention of heart attack and death.  Not needing any procedures or surgeries.: Ability to have an angiogram at the same time as this procedure to find any other coronary artery blockages.  : Quick and complete relief of chest pain (angina) symptoms.  : Needing to take fewer medicines.  :  Heart Bypass Surgery  Prevention of heart attack and death.: Prevention of heart attack and death.  Not needing any procedures or surgeries.: Improved chance of living longer, for people who have a blockage in the left main coronary artery.  : Quick and complete relief of chest pain (angina) symptoms.  : Needing to take fewer medicines.  : What are the risks?  Risks may include:  Stenting  Side effects from nitrates, such as headache, dizziness, and nausea.: Bleeding in the groin (hematoma) or abdomen (retroperitoneal bleeding).  Side effects from blood pressure medicines, such as cough, dizziness, or feeling tired.: Return of the blockage (restenosis).  Side effects from  anticoagulant medicines, such as bruising or bleeding.: Needing emergency coronary artery bypass grafting.  Allergic reaction.: Kidney injury.  : Infection.  : Possibility of a reaction to anesthetic medicines.  : Bleeding caused by medicines that are taken after the procedure.  : Heart attack. This is rare.  : Stroke or blood clot. This is rare.  :  :  Heart Bypass Surgery  Side effects from nitrates, such as headache, dizziness, and nausea.: Bleeding.  Side effects from blood pressure medicines, such as cough, dizziness, or feeling tired.: Heart attack.  Side effects from anticoagulant medicines, such as bruising or bleeding.: Stroke or blood clot.  Allergic reaction.: Kidney injury.  : Infection.  : Possibility of a reaction to anesthetic medicines.  : Scarring.  : Possibility of pain and discomfort after the surgery.  :  :  : What preparation is needed?  Preparation may include:  Stenting  None: Physical evaluation. This may involve lab work and X-rays.  : Adjusting medicines that you take or taking new medicines, depending on other medical conditions that you have.  Heart Bypass Surgery  None: Physical evaluation. This may involve lab work and X-rays.  : Adjusting medicines that you take or taking new medicines, depending on other medical conditions that you have. The exact tests and frequency of office visits will be decided by your health care provider based on your individual condition. What are the restrictions after?  Restrictions may include:  Stenting  Needing to avoid activities that have a high risk of injury, such as contact sports or working with sharp objects. These medicines can cause internal bleeding.: Needing to avoid activities for several months that have a high risk of injury, such as contact sports or working with sharp objects. These medicines can cause internal bleeding.  : Not lifting anything that is heavier than 10 lb (4.5  kg) for 3-7 days after the procedure.  : Not driving for 3-7 days after the procedure.  : Not doing activities that require a lot of energy for 3-7 days after the procedure.  Heart Bypass Surgery  Needing to avoid activities that have a high risk of injury, such as contact sports or working with sharp objects. These medicines can cause internal bleeding.: Avoiding crossing your legs or sitting for long periods at a time, if incisions were made in your legs.  : Avoiding lifting, pushing, or pulling anything that is heavier than 10 lb (4.5 kg) for at least 6 weeks after surgery.  : Not driving until your provider approves, usually at least 6 weeks after surgery.  : What can I expect from recovery?  You may experience:  Stenting  Side effects such as drowsiness or lack of energy. Side effects vary.: Recovery within a week, usually faster than heart bypass surgery.  Regular follow-up visits to check how well medicines are working.:  Heart Bypass Surgery  Side effects such as drowsiness or lack of energy. Side effects vary.: Recovery in 4-6 weeks or longer. Recovery time varies.  Regular follow-up visits to check how well medicines are working.: Pain that may last for several weeks. This varies. What is the impact to quality of life?  Impact to quality of life may include:  Stenting  Living with certain side effects.: Needing to take blood thinners for several months after the procedure. During this time, you will need to avoid activities that have a high risk of injury.  Needing to avoid activities that have a high risk of injury such as contact sports or working with sharp objects. Medications can increase risk for bleeding.: Making time for follow-up visits.  Making time for regular lab tests and follow-up visits.: Taking a few days off from work to recover.  :  Heart Bypass Surgery  Living with certain side effects.: Taking up to 6 weeks off from work and strenuous  activities to recover.  Needing to avoid activities that have a high risk of injury such as contact sports or working with sharp objects. Medications can increase risk for bleeding.: Having a scar from surgery.  Making time for regular lab tests and follow-up visits.: Making time for follow-up visits.  : Limitations on lifting and driving, usually for about 6 weeks. This may impact your regular schedule, including work. The frequency of office visits, therapy, and treatments will be decided by your health care provider and care team based on your individual condition. What is the cost?  Costs may include:  Stenting - $$  Ongoing medicines.: Procedures and recovery.  Heart Bypass Surgery - $$$  Ongoing medicines.: Procedures and recovery. Exact costs will vary based on your location and insurance plan. Contact your insurance company to find out what is covered. Questions to ask your health care provider:  Ask:  Stenting  Am I at risk for a heart attack?: Am I at risk for a heart attack?  How will I know if the medicines are working?: Am I at risk for complications from this treatment?  What can I do to lower my risk of a heart attack?: What can I do to lower my risk of a heart attack?  Am I eligible for this option?: Am I eligible for this option?  What are my risks if I choose this option?: What are my personal risks?  What are my personal risks?:  Heart Bypass Surgery  Am I at risk for a heart attack?: Am I at risk for a heart attack?  How will I know if the medicines are working?: Am I at risk for complications from surgery?  What can I do to lower my risk of a heart attack?: What can I do to lower my risk of a heart attack?  Am I eligible for this option?: Am I eligible for this option?  What are my risks if I choose this option?: What are my personal risks?  What are my personal risks?: Follow these instructions at home: Review these options and decide which  one may be right for you.  My decision:  Medicines Only  This is right for me::  This is not right for me::  I am unsure right now::  Stenting  This is right for me::  This is not right for me::  I am unsure right now::  Heart Bypass Surgery  This is right for me::  This is not right for me::  I am unsure right now:: This information is not intended to replace advice given to you by your health care provider. Make sure you discuss any questions you have with your health care provider. Document Released: 03/25/2016 Document Revised: 03/25/2016 Document Reviewed: 03/25/2016 Elsevier Interactive Patient Education  2017 [employer]  Fat and Cholesterol Restricted Diet Getting too much fat and cholesterol  in your diet may cause health problems. Following this diet helps keep your fat and cholesterol at normal levels. This can keep you from getting sick. What types of fat should I choose?  Choose monosaturated and polyunsaturated fats. These are found in foods such as olive oil, canola oil, flaxseeds, walnuts, almonds, and seeds.  Eat more omega-3 fats. Good choices include salmon, mackerel, sardines, tuna, flaxseed oil, and ground flaxseeds.  Limit saturated fats. These are in animal products such as meats, butter, and cream. They can also be in plant products such as palm oil, palm kernel oil, and coconut oil.  Avoid foods with partially hydrogenated oils in them. These contain trans fats. Examples of foods that have trans fats are stick margarine, some tub margarines, cookies, crackers, and other baked goods. What general guidelines do I need to follow?  Check food labels. Look for the words "trans fat" and "saturated fat."  When preparing a meal:  Fill half of your plate with vegetables and green salads.  Fill one fourth of your plate with whole grains. Look for the word "whole" as the first word in the ingredient list.  Fill one fourth of your plate with lean  protein foods.  Eat more foods that have fiber, like apples, carrots, beans, peas, and barley.  Eat more home-cooked foods. Eat less at restaurants and buffets.  Limit or avoid alcohol.  Limit foods high in starch and sugar.  Limit fried foods.  Cook foods without frying them. Baking, boiling, grilling, and broiling are all great options.  Lose weight if you are overweight. Losing even a small amount of weight can help your overall health. It can also help prevent diseases such as diabetes and heart disease. What foods can I eat? Grains  Whole grains, such as whole wheat or whole grain breads, crackers, cereals, and pasta. Unsweetened oatmeal, bulgur, barley, quinoa, or brown rice. Corn or whole wheat flour tortillas. Vegetables  Fresh or frozen vegetables (raw, steamed, roasted, or grilled). Green salads. Fruits  All fresh, canned (in natural juice), or frozen fruits. Meat and Other Protein Products  Ground beef (85% or leaner), grass-fed beef, or beef trimmed of fat. Skinless chicken or [place]. Ground chicken or [place]. Pork trimmed of fat. All fish and seafood. Eggs. Dried beans, peas, or lentils. Unsalted nuts or seeds. Unsalted canned or dry beans. Dairy  Low-fat dairy products, such as skim or 1% milk, 2% or reduced-fat cheeses, low-fat ricotta or cottage cheese, or plain low-fat yogurt. Fats and Oils  Tub margarines without trans fats. Light or reduced-fat mayonnaise and salad dressings. Avocado. Olive, canola, sesame, or safflower oils. Natural peanut or almond butter (choose ones without added sugar and oil). The items listed above may not be a complete list of recommended foods or beverages. Contact your dietitian for more options.  What foods are not recommended? Grains  White bread. White pasta. White rice. Cornbread. Bagels, pastries, and croissants. Crackers that contain trans fat. Vegetables  White potatoes. Corn. Creamed or fried vegetables. Vegetables in a cheese  sauce. Fruits  Dried fruits. Canned fruit in light or heavy syrup. Fruit juice. Meat and Other Protein Products  Fatty cuts of meat. Ribs, chicken wings, bacon, sausage, bologna, salami, chitterlings, fatback, hot dogs, bratwurst, and packaged luncheon meats. Liver and organ meats. Dairy  Whole or 2% milk, cream, half-and-half, and cream cheese. Whole milk cheeses. Whole-fat or sweetened yogurt. Full-fat cheeses. Nondairy creamers and whipped toppings. Processed cheese, cheese spreads, or cheese curds. Sweets and  Desserts  Corn syrup, sugars, honey, and molasses. Candy. Jam and jelly. Syrup. Sweetened cereals. Cookies, pies, cakes, donuts, muffins, and ice cream. Fats and Oils  Butter, stick margarine, lard, shortening, ghee, or bacon fat. Coconut, palm kernel, or palm oils. Beverages  Alcohol. Sweetened drinks (such as sodas, lemonade, and fruit drinks or punches). The items listed above may not be a complete list of foods and beverages to avoid. Contact your dietitian for more information.  This information is not intended to replace advice given to you by your health care provider. Make sure you discuss any questions you have with your health care provider. Document Released: 11/02/2011 Document Revised: 01/08/2016 Document Reviewed: 08/02/2013 Elsevier Interactive Patient Education  2017 [employer].

## 2016-08-26 NOTE — Progress Notes (Signed)
Subjective:    Patient ID: Kara Key, female    DOB: Feb 09, 1966, 51 y.o.   MRN: [id number]  Knee Pain   There was no injury mechanism (Ms. Kara Key has been followed for knee pain over the past several months. She was given Tramadol and Meloxicam greater than 1 month ago and states that pain has improved some. ). The pain is present in the left knee and right knee. The pain is at a severity of 3/10. The pain is mild. Associated symptoms include an inability to bear weight and muscle weakness. She has tried heat, non-weight bearing, NSAIDs and rest (She has also been attempting to lose weight and is down 5 pounds. ) for the symptoms. The treatment provided moderate relief.   Ms. Kara Key has also started working out at [employer] with a trainer to help with ongoing knee issues. She has only used services minimally.   Past Medical History:  Diagnosis Date  . A-fib (HCC)   . Arthritis   . Asthma    Social History   Social History  . Marital status: Single    Spouse name: N/A  . Number of children: N/A  . Years of education: N/A   Occupational History  . Not on file.   Social History Main Topics  . Smoking status: Never Smoker  . Smokeless tobacco: Never Used  . Alcohol use Yes     Comment: occ  . Drug use: No  . Sexual activity: Not on file   Other Topics Concern  . Not on file   Social History Narrative  . No narrative on file   Immunization History  Administered Date(s) Administered  . Tdap 04/03/2015  No Known Allergies  Review of Systems  Constitutional: Negative.  Negative for fatigue.  HENT: Negative.   Respiratory: Negative.   Cardiovascular: Negative.   Endocrine: Negative for polydipsia, polyphagia and polyuria.  Genitourinary: Negative.   Musculoskeletal: Positive for arthralgias (chronic knee pain).  Allergic/Immunologic: Negative.   Neurological: Negative.   Hematological: Negative.   Psychiatric/Behavioral: Negative.        Objective:   Physical Exam  Constitutional: She is oriented to person, place, and time. She appears well-developed and well-nourished.  HENT:  Head: Normocephalic and atraumatic.  Right Ear: External ear normal.  Left Ear: External ear normal.  Nose: Nose normal.  Mouth/Throat: Oropharynx is clear and moist.  Eyes: Conjunctivae and EOM are normal. Pupils are equal, round, and reactive to light.  Neck: Normal range of motion. Neck supple.  Cardiovascular: Normal rate, regular rhythm, normal heart sounds and intact distal pulses.   Pulmonary/Chest: Effort normal and breath sounds normal.  Abdominal: Soft. Bowel sounds are normal.  Musculoskeletal:       Right knee: She exhibits decreased range of motion. She exhibits no swelling.       Left knee: She exhibits normal range of motion and no swelling.  Neurological: She is alert and oriented to person, place, and time. She has normal reflexes.  Skin: Skin is warm and dry.  Psychiatric: She has a normal mood and affect. Her behavior is normal. Judgment and thought content normal.     BP (!) 102/58 (BP Location: Left Arm, Patient Position: Sitting, Cuff Size: Large)   Pulse 83   Temp 97.3 F (36.3 C) (Oral)   Resp 16   Ht  (1.651 m)   Wt 289 lb (131.1 kg)   SpO2 100%   BMI 48.09 kg/m  Assessment &  Plan:  1. Chronic pain of both knees Continue diet and exercise regimen. Extra pounds increase the stress on your knees, which can cause chronic pain and lead to other knee-related complications, such as arthritis or osteoarthritis. Regular exercise can lessen and alleviate overweight and obesity-related knee pain, stiffness, and swelling  - meloxicam (MOBIC) 7.5 MG tablet; Take 1 tablet (7.5 mg total) by mouth daily.  Dispense: 30 tablet; Refill: 1 - traMADol (ULTRAM) 50 MG tablet; Take 1 tablet (50 mg total) by mouth every 8 (eight) hours as needed.  Dispense: 30 tablet; Refill: 0  2. Essential hypertension Blood pressure is at goal on current  medication regimen.   3. Environmental allergies  - levocetirizine (XYZAL) 5 MG tablet; Take 1 tablet (5 mg total) by mouth every evening.  Dispense: 30 tablet; Refill: 5  4. Morbid obesity (HCC) Recommend a lowfat, low carbohydrate diet divided over 5-6 small meals, increase water intake to 6-8 glasses, and 150 minutes per week of cardiovascular exercise.     RTC: 3 months for hypertension   Nolon Nations  MSN, FNP-C [facility] [address] [place], [place] [address] [phone]

## 2016-09-27 MED FILL — ATORVASTATIN 20 MG TABLET: 20 | 30 days supply | Qty: 30 | Fill #4

## 2016-10-12 ENCOUNTER — Other Ambulatory Visit: Payer: Self-pay | Admitting: Family Medicine

## 2016-10-12 DIAGNOSIS — I10 Essential (primary) hypertension: Secondary | ICD-10-CM

## 2016-10-12 MED ORDER — HYDROCHLOROTHIAZIDE 12.5 MG PO TABS: 12.5000 mg | ORAL_TABLET | Freq: Every day | ORAL | 0 refills | Status: DC

## 2016-10-12 MED FILL — HYDROCHLOROTHIAZIDE 12.5 MG: 12.5 | 30 days supply | Qty: 30 | Fill #0

## 2016-11-19 ENCOUNTER — Encounter: Payer: Self-pay | Admitting: Family Medicine

## 2016-11-19 ENCOUNTER — Other Ambulatory Visit: Payer: Self-pay | Admitting: Family Medicine

## 2016-11-19 DIAGNOSIS — M25562 Pain in left knee: Principal | ICD-10-CM

## 2016-11-19 DIAGNOSIS — G8929 Other chronic pain: Secondary | ICD-10-CM

## 2016-11-19 DIAGNOSIS — M25561 Pain in right knee: Principal | ICD-10-CM

## 2016-11-25 ENCOUNTER — Ambulatory Visit (INDEPENDENT_AMBULATORY_CARE_PROVIDER_SITE_OTHER): Payer: Self-pay | Admitting: Family Medicine

## 2016-11-25 ENCOUNTER — Encounter: Payer: Self-pay | Admitting: Family Medicine

## 2016-11-25 VITALS — BP 110/74 | HR 82 | Temp 97.7°F | Resp 16 | Ht 65.0 in | Wt 291.0 lb

## 2016-11-25 DIAGNOSIS — Z131 Encounter for screening for diabetes mellitus: Secondary | ICD-10-CM

## 2016-11-25 DIAGNOSIS — M25562 Pain in left knee: Secondary | ICD-10-CM

## 2016-11-25 DIAGNOSIS — E785 Hyperlipidemia, unspecified: Secondary | ICD-10-CM

## 2016-11-25 DIAGNOSIS — Z9109 Other allergy status, other than to drugs and biological substances: Secondary | ICD-10-CM

## 2016-11-25 DIAGNOSIS — I10 Essential (primary) hypertension: Secondary | ICD-10-CM

## 2016-11-25 DIAGNOSIS — M25561 Pain in right knee: Secondary | ICD-10-CM

## 2016-11-25 DIAGNOSIS — G8929 Other chronic pain: Secondary | ICD-10-CM

## 2016-11-25 LAB — POCT URINALYSIS DIP (DEVICE)
Bilirubin Urine: NEGATIVE
Glucose, UA: NEGATIVE mg/dL
HGB URINE DIPSTICK: NEGATIVE
Ketones, ur: NEGATIVE mg/dL
LEUKOCYTES UA: NEGATIVE
NITRITE: NEGATIVE
PH: 5.5 (ref 5.0–8.0)
Protein, ur: NEGATIVE mg/dL
Specific Gravity, Urine: 1.015 (ref 1.005–1.030)
UROBILINOGEN UA: 0.2 mg/dL (ref 0.0–1.0)

## 2016-11-25 MED ORDER — LEVOCETIRIZINE DIHYDROCHLORIDE 5 MG PO TABS: 5.0000 mg | ORAL_TABLET | Freq: Every evening | ORAL | 5 refills | Status: AC

## 2016-11-25 MED ORDER — MELOXICAM 7.5 MG PO TABS: 7.5000 mg | ORAL_TABLET | Freq: Every day | ORAL | 1 refills | Status: DC

## 2016-11-25 MED ORDER — HYDROCHLOROTHIAZIDE 12.5 MG PO TABS: 12.5000 mg | ORAL_TABLET | Freq: Every day | ORAL | 0 refills | Status: DC

## 2016-11-25 MED ORDER — ALBUTEROL SULFATE HFA 108 (90 BASE) MCG/ACT IN AERS: 2.0000 | INHALATION_SPRAY | RESPIRATORY_TRACT | 1 refills | Status: DC | PRN

## 2016-11-25 MED ORDER — TRAMADOL HCL 50 MG PO TABS: 50.0000 mg | ORAL_TABLET | Freq: Three times a day (TID) | ORAL | 0 refills | Status: DC | PRN

## 2016-11-25 MED ORDER — LEVOCETIRIZINE DIHYDROCHLORIDE 5 MG PO TABS: 5.0000 mg | ORAL_TABLET | Freq: Every evening | ORAL | 5 refills | Status: DC

## 2016-11-25 MED FILL — ?HYDROCHLOROTHIAZIDE 12.5MG: 12.5 | 30 days supply | Qty: 30 | Fill #0

## 2016-11-25 MED FILL — !VENTOLIN HFA INHALER: 108 (90 BAS | 25 days supply | Qty: 18 | Fill #0

## 2016-11-25 MED FILL — MELOXICAM 7.5 MG TABLET: 7.5 | 30 days supply | Qty: 60 | Fill #0

## 2016-11-25 NOTE — Progress Notes (Signed)
Patient ID: Kara Key, female    DOB: 25-Mar-1966, 51 y.o.   MRN: [id number]  PCP: Massie Maroon, FNP  Chief Complaint  Patient presents with  . Follow-up    3 Month on blood pressure    Subjective:  HPI Kara Key is a 51 y.o. female presents for blood pressure follow-up.  Hypertension Reports no home monitoring of blood pressure. Reports adherence to blood pressure medication, HCTZ 12.5 mg once daily. Reports nonadherence to low sodium diet, although attempt to incorporate some low sodium foods into her diet. She is a nonsmoker. She has incorporated exercise into her daily weekly routine and efforts to lose weight. She has joined a gym and works out on average 3 days per week.  Denies any episodes of dizziness, headaches, shortness of breath, or chest pain.  Other complaints addressed today: Reports persistent congestion for over the last 2 weeks. She has associated intermittent cough which is sometimes productive of yellowish greenish sputum and some nasal drainage. She denies headache, facial pain, shortness of breath. She suffers from asthma and does report some occasional wheezing at least 1-2 times per week itches relieved which is relieve with use of albuterol. [name] reports that someone gave her an albuterol inhaler and she does not have her own due to cost. She was previously prescribed Levocetirizine, admits that she has not purchased the medication for control of allergy-related symptoms. [name] also suffers from chronic bilateral knee pain which is exacerbated by exercise, persistent walking, persistent sitting, and obesity. She has been using tramadol on an as-needed basis and meloxicam for relief of pain. She requested a refill on both medications today. Patient also requests a screening for diabetes today as she has a positive family history including her mother, maternal grandfather and maternal aunt. Denies any symptoms of polydipsia, polyuria, or polyphagia.  Social  History   Social History  . Marital status: Single    Spouse name: N/A  . Number of children: N/A  . Years of education: N/A   Occupational History  . Not on file.   Social History Main Topics  . Smoking status: Never Smoker  . Smokeless tobacco: Never Used  . Alcohol use Yes     Comment: occ  . Drug use: No  . Sexual activity: Not on file   Other Topics Concern  . Not on file   Social History Narrative  . No narrative on file   History reviewed. No pertinent family history.   Review of Systems See history of present illness  Patient Active Problem List   Diagnosis Date Noted  . Essential hypertension 07/22/2016  . Hyperlipidemia 10/24/2015  . Morbid obesity (HCC) 10/23/2015  . Chronic pain of both knees 10/23/2015  . Acute frontal sinusitis 10/23/2015  . Metabolic syndrome 10/23/2015   No Known Allergies  Prior to Admission medications   Medication Sig Start Date End Date Taking? Authorizing Provider  atorvastatin (LIPITOR) 20 MG tablet Take 1 tablet (20 mg total) by mouth daily. 10/24/15  Yes Massie Maroon, FNP  Biotin 5000 MCG CAPS Take 1 capsule by mouth 1 day or 1 dose.   Yes [provider]  Cholecalciferol (VITAMIN D3) 1000 units CAPS Take by mouth.   Yes [provider]  Cyanocobalamin (VITAMIN B 12 PO) Take by mouth.   Yes [provider]  hydrochlorothiazide (HYDRODIURIL) 12.5 MG tablet Take 1 tablet (12.5 mg total) by mouth daily. 10/12/16  Yes Massie Maroon, FNP  ibuprofen (  ADVIL,MOTRIN) 800 MG tablet Take 1 tablet (800 mg total) by mouth 3 (three) times daily. 05/05/16  Yes Ward, Chase PicketJaime Pilcher, PA-C  levocetirizine (XYZAL) 5 MG tablet Take 1 tablet (5 mg total) by mouth every evening. 08/26/16  Yes Massie MaroonHollis, Lachina M, FNP  meloxicam (MOBIC) 7.5 MG tablet Take 1 tablet (7.5 mg total) by mouth daily. 08/26/16  Yes Massie MaroonHollis, Lachina M, FNP  multivitamin-iron-minerals-folic acid (CENTRUM) chewable tablet Chew 1 tablet by mouth  daily.   Yes [provider]  psyllium (METAMUCIL) 58.6 % packet Take 1 packet by mouth daily.   Yes [provider]  traMADol (ULTRAM) 50 MG tablet Take 1 tablet (50 mg total) by mouth every 8 (eight) hours as needed. 08/26/16  Yes Massie MaroonHollis, Lachina M, FNP  naproxen (NAPROSYN) 500 MG tablet Take 1 tablet (500 mg total) by mouth 2 (two) times daily with a meal. Patient not taking: Reported on 11/25/2016 01/15/16   Henrietta HooverBernhardt, Linda C, NP  Past Medical, Surgical Family and Social History reviewed and updated.  Objective:   Today's Vitals   11/25/16 0857  BP: 110/74  Pulse: 82  Resp: 16  Temp: 97.7 F (36.5 C)  TempSrc: Oral  SpO2: 96%  Weight: 291 lb (132 kg)  Height: 5\' 5"  (1.651 m)   Wt Readings from Last 3 Encounters:  11/25/16 291 lb (132 kg)  08/26/16 289 lb (131.1 kg)  07/22/16 293 lb 12.8 oz (133.3 kg)   Physical Exam  Constitutional: She is oriented to person, place, and time. She appears well-developed and well-nourished.  HENT:  Head: Normocephalic and atraumatic.  Right Ear: External ear normal.  Left Ear: External ear normal.  Nose: Rhinorrhea present.  Mouth/Throat: Oropharynx is clear and moist.  Eyes: Pupils are equal, round, and reactive to light. Conjunctivae and EOM are normal.  Cardiovascular: Normal rate, regular rhythm, normal heart sounds and intact distal pulses.   Pulmonary/Chest: Effort normal and breath sounds normal. She has no wheezes. She has no rales. She exhibits no tenderness.  Abdominal: Soft. Bowel sounds are normal.  Musculoskeletal: Normal range of motion. She exhibits tenderness.  Chronic bilateral knee tenderness. No evidence of effusions, edema, or limited range of motion.  Neurological: She is alert and oriented to person, place, and time.  Skin: Skin is warm and dry.  Psychiatric: She has a normal mood and affect. Her behavior is normal. Judgment and thought content normal.   Assessment & Plan:  1. Essential  hypertension, well controlled today. - Continue hydrochlorothiazide (HYDRODIURIL) 12.5 MG tablet daily. - COMPLETE METABOLIC PANEL WITH GFR; Future - Lipid panel; Future   2. Chronic pain of both knees - meloxicam (MOBIC) 7.5 MG tablet; Take 1-2 tablets (7.5-15 mg total) by mouth daily.   - traMADol (ULTRAM) 50 MG tablet; Take 1 tablet (50 mg total) by mouth every 8 (eight) hours as needed.    3. Screening for diabetes mellitus -- Hemoglobin A1c; Future  4. Environmental allergies - levocetirizine (XYZAL) 5 MG tablet; Take 1 tablet (5 mg total) by mouth every evening.  Dispense: 30 tablet; Refill: 5 - CBC with Differential; Future  5. Hyperlipidemia, unspecified hyperlipidemia type - Lipid panel; Future - Hemoglobin A1c; Future  RTC: 1 week for fasting labs and in 6 months for chronic disease management.   Godfrey PickKimberly S. Tiburcio PeaHarris, MSN, FNP-C [facility]  [address] [place], [place] [address] [phone]

## 2016-11-25 NOTE — Patient Instructions (Signed)
Return 1 weeks and 6 months for follow-up of chronic disease with Kara Key    Knee Pain, Adult Many things can cause knee pain. The pain often goes away on its own with time and rest. If the pain does not go away, tests may be done to find out what is causing the pain. Follow these instructions at home: Activity  Rest your knee.  Do not do things that cause pain.  Avoid activities where both feet leave the ground at the same time (high-impact activities). Examples are running, jumping rope, and doing jumping jacks. General instructions  Take medicines only as told by your doctor.  Raise (elevate) your knee when you are resting. Make sure your knee is higher than your heart.  Sleep with a pillow under your knee.  If told, put ice on the knee: ? Put ice in a plastic bag. ? Place a towel between your skin and the bag. ? Leave the ice on for 20 minutes, 2-3 times a day.  Ask your doctor if you should wear an elastic knee support.  Lose weight if you are overweight. Being overweight can make your knee hurt more.  Do not use any tobacco products. These include cigarettes, chewing tobacco, or electronic cigarettes. If you need help quitting, ask your doctor. Smoking may slow down healing. Contact a doctor if:  The pain does not stop.  The pain changes or gets worse.  You have a fever along with knee pain.  Your knee gives out or locks up.  Your knee swells, and becomes worse. Get help right away if:  Your knee feels warm.  You cannot move your knee.  You have very bad knee pain.  You have chest pain.  You have trouble breathing. Summary  Many things can cause knee pain. The pain often goes away on its own with time and rest.  Avoid activities that put stress on your knee. These include running and jumping rope.  Get help right away if you cannot move your knee, or if your knee feels warm, or if you have trouble breathing. This information is not intended to  replace advice given to you by your health care provider. Make sure you discuss any questions you have with your health care provider. Document Released: 07/30/2008 Document Revised: 04/27/2016 Document Reviewed: 04/27/2016 Elsevier Interactive Patient Education  2017 Elsevier Inc.  Chronic Pain, Adult Chronic pain is a type of pain that lasts or keeps coming back (recurs) for at least six months. You may have chronic headaches, abdominal pain, or body pain. Chronic pain may be related to an illness, such as fibromyalgia or complex regional pain syndrome. Sometimes the cause of chronic pain is not known. Chronic pain can make it hard for you to do daily activities. If not treated, chronic pain can lead to other health problems, including anxiety and depression. Treatment depends on the cause and severity of your pain. You may need to work with a pain specialist to come up with a treatment plan. The plan may include medicine, counseling, and physical therapy. Many people benefit from a combination of two or more types of treatment to control their pain. Follow these instructions at home: Lifestyle  Consider keeping a pain diary to share with your health care providers.  Consider talking with a mental health care provider (psychologist) about how to cope with chronic pain.  Consider joining a chronic pain support group.  Try to control or lower your stress levels. Talk to your  health care provider about strategies to do this. General instructions   Take over-the-counter and prescription medicines only as told by your health care provider.  Follow your treatment plan as told by your health care provider. This may include: ? Gentle, regular exercise. ? Eating a healthy diet that includes foods such as vegetables, fruits, fish, and lean meats. ? Cognitive or behavioral therapy. ? Working with a Adult nursephysical therapist. ? Meditation or yoga. ? Acupuncture or massage therapy. ? Aroma, color, light,  or sound therapy. ? Local electrical stimulation. ? Shots (injections) of numbing or pain-relieving medicines into the spine or the area of pain.  Check your pain level as told by your health care provider. Ask your health care provider if you should use a pain scale.  Learn as much as you can about how to manage your chronic pain. Ask your health care provider if an intensive pain rehabilitation program or a chronic pain specialist would be helpful.  Keep all follow-up visits as told by your health care provider. This is important. Contact a health care provider if:  Your pain gets worse.  You have new pain.  You have trouble sleeping.  You have trouble doing your normal activities.  Your pain is not controlled with treatment.  Your have side effects from pain medicine.  You feel weak. Get help right away if:  You lose feeling or have numbness in your body.  You lose control of bowel or bladder function.  Your pain suddenly gets much worse.  You develop shaking or chills.  You develop confusion.  You develop chest pain.  You have trouble breathing or shortness of breath.  You pass out.  You have thoughts about hurting yourself or others. This information is not intended to replace advice given to you by your health care provider. Make sure you discuss any questions you have with your health care provider. Document Released: 01/23/2002 Document Revised: 01/01/2016 Document Reviewed: 10/21/2015 Elsevier Interactive Patient Education  2017 [employer]

## 2016-12-02 ENCOUNTER — Other Ambulatory Visit: Payer: Self-pay

## 2016-12-02 DIAGNOSIS — I10 Essential (primary) hypertension: Secondary | ICD-10-CM

## 2016-12-02 DIAGNOSIS — E785 Hyperlipidemia, unspecified: Secondary | ICD-10-CM

## 2016-12-02 DIAGNOSIS — Z9109 Other allergy status, other than to drugs and biological substances: Secondary | ICD-10-CM

## 2016-12-02 LAB — CBC WITH DIFFERENTIAL/PLATELET
BASOS PCT: 0 %
Basophils Absolute: 0 cells/uL (ref 0–200)
EOS ABS: 144 {cells}/uL (ref 15–500)
Eosinophils Relative: 3 %
HEMATOCRIT: 41.2 % (ref 35.0–45.0)
HEMOGLOBIN: 13.5 g/dL (ref 11.7–15.5)
LYMPHS ABS: 1968 {cells}/uL (ref 850–3900)
Lymphocytes Relative: 41 %
MCH: 28.8 pg (ref 27.0–33.0)
MCHC: 32.8 g/dL (ref 32.0–36.0)
MCV: 87.8 fL (ref 80.0–100.0)
MONO ABS: 432 {cells}/uL (ref 200–950)
MPV: 9.7 fL (ref 7.5–12.5)
Monocytes Relative: 9 %
NEUTROS ABS: 2256 {cells}/uL (ref 1500–7800)
Neutrophils Relative %: 47 %
PLATELETS: 341 10*3/uL (ref 140–400)
RBC: 4.69 MIL/uL (ref 3.80–5.10)
RDW: 13 % (ref 11.0–15.0)
WBC: 4.8 10*3/uL (ref 3.8–10.8)

## 2016-12-03 LAB — COMPLETE METABOLIC PANEL WITH GFR
ALBUMIN: 3.3 g/dL — AB (ref 3.6–5.1)
ALT: 12 U/L (ref 6–29)
AST: 16 U/L (ref 10–35)
Alkaline Phosphatase: 86 U/L (ref 33–130)
BILIRUBIN TOTAL: 0.4 mg/dL (ref 0.2–1.2)
BUN: 12 mg/dL (ref 7–25)
CALCIUM: 9.2 mg/dL (ref 8.6–10.4)
CO2: 23 mmol/L (ref 20–31)
CREATININE: 0.95 mg/dL (ref 0.50–1.05)
Chloride: 103 mmol/L (ref 98–110)
GFR, EST AFRICAN AMERICAN: 80 mL/min (ref >=60)
GFR, Est Non African American: 69 mL/min (ref >=60)
Glucose, Bld: 90 mg/dL (ref 65–99)
Potassium: 4 mmol/L (ref 3.5–5.3)
Sodium: 141 mmol/L (ref 135–146)
TOTAL PROTEIN: 7.4 g/dL (ref 6.1–8.1)

## 2016-12-03 LAB — HEMOGLOBIN A1C
HEMOGLOBIN A1C: 5.2 % (ref <5.7)
Mean Plasma Glucose: 103 mg/dL

## 2016-12-03 LAB — LIPID PANEL
Cholesterol: 168 mg/dL (ref <200)
HDL: 48 mg/dL — ABNORMAL LOW (ref >50)
LDL CALC: 89 mg/dL (ref <100)
TRIGLYCERIDES: 153 mg/dL — AB (ref <150)
Total CHOL/HDL Ratio: 3.5 Ratio (ref <5.0)
VLDL: 31 mg/dL — ABNORMAL HIGH (ref <30)

## 2016-12-16 ENCOUNTER — Ambulatory Visit: Payer: Self-pay | Attending: Family Medicine

## 2017-01-18 ENCOUNTER — Other Ambulatory Visit: Payer: Self-pay | Admitting: *Deleted

## 2017-01-18 MED ORDER — ALBUTEROL SULFATE HFA 108 (90 BASE) MCG/ACT IN AERS
2.0000 | INHALATION_SPRAY | RESPIRATORY_TRACT | 3 refills | Status: AC | PRN
Start: 2017-01-18 — End: ?

## 2017-01-18 NOTE — Telephone Encounter (Signed)
PRINTED FOR PASS PROGRAM 

## 2017-01-30 ENCOUNTER — Encounter: Payer: Self-pay | Admitting: Family Medicine

## 2017-02-25 ENCOUNTER — Ambulatory Visit: Payer: Self-pay | Admitting: Family Medicine

## 2017-03-30 ENCOUNTER — Other Ambulatory Visit: Payer: Self-pay | Admitting: Family Medicine

## 2017-03-30 ENCOUNTER — Encounter: Payer: Self-pay | Admitting: Family Medicine

## 2017-03-30 DIAGNOSIS — M25562 Pain in left knee: Principal | ICD-10-CM

## 2017-03-30 DIAGNOSIS — M25561 Pain in right knee: Principal | ICD-10-CM

## 2017-03-30 DIAGNOSIS — G8929 Other chronic pain: Secondary | ICD-10-CM

## 2017-03-31 ENCOUNTER — Other Ambulatory Visit: Payer: Self-pay | Admitting: Family Medicine

## 2017-03-31 DIAGNOSIS — G8929 Other chronic pain: Secondary | ICD-10-CM

## 2017-03-31 DIAGNOSIS — M25561 Pain in right knee: Principal | ICD-10-CM

## 2017-03-31 DIAGNOSIS — M25562 Pain in left knee: Principal | ICD-10-CM

## 2017-03-31 MED ORDER — DICLOFENAC SODIUM 25 MG PO TBEC: 75.0000 mg | DELAYED_RELEASE_TABLET | Freq: Two times a day (BID) | ORAL | 1 refills | Status: DC

## 2017-03-31 MED ORDER — TRAMADOL HCL 50 MG PO TABS: 50.0000 mg | ORAL_TABLET | Freq: Three times a day (TID) | ORAL | 0 refills | Status: DC | PRN

## 2017-03-31 NOTE — Progress Notes (Signed)
Reviewed [place] Substance Reporting system prior to prescribing opiate medications. No inconsistencies noted.  Meds ordered this encounter  Medications  . traMADol (ULTRAM) 50 MG tablet    Sig: Take 1 tablet (50 mg total) every 8 (eight) hours as needed by mouth.    Dispense:  30 tablet    Refill:  0    Order Specific Question:   Supervising Provider    Answer:   Quentin AngstJEGEDE, OLUGBEMIGA E [id number]]  . diclofenac (VOLTAREN) 25 MG EC tablet    Sig: Take 3 tablets (75 mg total) 2 (two) times daily by mouth.    Dispense:  60 tablet    Refill:  1    Nolon NationsLaChina Moore Hollis  MSN, FNP-C Patient [facility] [address]  [place], [place] [address] [phone]

## 2017-05-23 ENCOUNTER — Ambulatory Visit: Payer: Managed Care, Other (non HMO) | Admitting: Family Medicine

## 2017-05-23 ENCOUNTER — Encounter: Payer: Self-pay | Admitting: Family Medicine

## 2017-05-23 VITALS — BP 136/76 | HR 83 | Temp 97.7°F | Resp 16 | Ht 65.0 in | Wt 303.0 lb

## 2017-05-23 DIAGNOSIS — F329 Major depressive disorder, single episode, unspecified: Secondary | ICD-10-CM | POA: Diagnosis not present

## 2017-05-23 DIAGNOSIS — R9431 Abnormal electrocardiogram [ECG] [EKG]: Secondary | ICD-10-CM | POA: Diagnosis not present

## 2017-05-23 DIAGNOSIS — I10 Essential (primary) hypertension: Secondary | ICD-10-CM | POA: Diagnosis not present

## 2017-05-23 DIAGNOSIS — Z1211 Encounter for screening for malignant neoplasm of colon: Secondary | ICD-10-CM | POA: Diagnosis not present

## 2017-05-23 DIAGNOSIS — F32A Depression, unspecified: Secondary | ICD-10-CM

## 2017-05-23 DIAGNOSIS — M159 Polyosteoarthritis, unspecified: Secondary | ICD-10-CM | POA: Diagnosis not present

## 2017-05-23 DIAGNOSIS — M25562 Pain in left knee: Secondary | ICD-10-CM | POA: Diagnosis not present

## 2017-05-23 DIAGNOSIS — M25561 Pain in right knee: Secondary | ICD-10-CM

## 2017-05-23 DIAGNOSIS — Z1231 Encounter for screening mammogram for malignant neoplasm of breast: Secondary | ICD-10-CM

## 2017-05-23 DIAGNOSIS — G8929 Other chronic pain: Secondary | ICD-10-CM

## 2017-05-23 DIAGNOSIS — E8881 Metabolic syndrome: Secondary | ICD-10-CM | POA: Diagnosis not present

## 2017-05-23 DIAGNOSIS — Z8679 Personal history of other diseases of the circulatory system: Secondary | ICD-10-CM | POA: Diagnosis not present

## 2017-05-23 DIAGNOSIS — Z1239 Encounter for other screening for malignant neoplasm of breast: Secondary | ICD-10-CM

## 2017-05-23 LAB — POCT URINALYSIS DIP (DEVICE)
BILIRUBIN URINE: NEGATIVE
Glucose, UA: NEGATIVE mg/dL
HGB URINE DIPSTICK: NEGATIVE
KETONES UR: NEGATIVE mg/dL
LEUKOCYTES UA: NEGATIVE
NITRITE: NEGATIVE
Protein, ur: NEGATIVE mg/dL
Specific Gravity, Urine: 1.02 (ref 1.005–1.030)
Urobilinogen, UA: 0.2 mg/dL (ref 0.0–1.0)
pH: 6.5 (ref 5.0–8.0)

## 2017-05-23 LAB — POCT GLYCOSYLATED HEMOGLOBIN (HGB A1C): Hemoglobin A1C: 5.2

## 2017-05-23 MED ORDER — BUPROPION HCL ER (SR) 150 MG PO TB12: ORAL_TABLET | ORAL | 5 refills | Status: DC

## 2017-05-23 NOTE — Patient Instructions (Signed)
Due to history of depression, will start a trial of Wellbutrin 150 mg twice daily.  To start take 150 mg daily, if well tolerated increase dose to 150 mg twice daily. For morbid obesity, follow-up with bariatric surgeon.  I have checked thyroid, and screened for type 2 diabetes.  We will follow-up by phone with any abnormal lab results. Your blood pressure is at goal on current medication regimen, no changes warranted on today Routine screening colonoscopy started age 52.  We will send a referral for screening colonoscopy.  Initial appointment with gastroenterology is typically a consultation. You are in need of a screening mammogram, I have placed order.  Please call the breast center to schedule appointment for mammogram. For metabolic syndrome, I recommend a low-fat low carbohydrate diet divided over 5-6 small meals throughout the day.  Also, increase water intake to 6-8 glasses/day.  Attempt to eat your largest meal earlier in the day and saviors small meals to the end of the day.  Refrain from eating after 8 PM.  Recommend 30 minutes of low impact cardiovascular exercise 3 times per week. You have chronic knee pain, will send referral to orthopedic services for further workup and evaluation.  I will defer to orthopedic specialist for treatment plan.

## 2017-05-23 NOTE — Progress Notes (Signed)
Chief Complaint  Patient presents with  . Hypertension  . Leg Pain    bilateral      Subjective:    Patient ID: Kara Key, female    DOB: 06/22/1965, 52 y.o.   MRN: [id number]  HPI Kara Key, 52 year old female with a history of atrial fibrillation, morbid obesity, chronic knee pain, and hypertension presents for chronic conditions. Patient has previously  not been able to follow up consistently due to insurance constraints. Kara Key has a history of hypertension that has been controlled on thiazide diuretics. She does not exercise routinely or follow a low fat, low sodium diet. Kara Key does not check blood pressure at home. She denies headaches, dizziness, chest pain, heart palpitations or lower extremity edema.  She has a history of atrial fibrillation. She has not been on anticoagulants and is not followed by cardiology. She is currently asymptomatic.  Patient has a history of bilateral knee pain. Bilateral knee pain has been present for 3 years or greater.  Current symptoms include stiffness and swelling. Pain is aggravated by any weight bearing, going up and down stairs, kneeling, lateral movements and rising after sitting.   Patient complains of depression. She complains of anhedonia. Onset was several months ago.   She denies current suicidal and homicidal plan or intent.   She has not been treated for depression in the past.  Past Medical History:  Diagnosis Date  . A-fib (HCC)   . Arthritis   . Asthma    Social History   Socioeconomic History  . Marital status: Single    Spouse name: Not on file  . Number of children: Not on file  . Years of education: Not on file  . Highest education level: Not on file  Social Needs  . Financial resource strain: Not on file  . Food insecurity - worry: Not on file  . Food insecurity - inability: Not on file  . Transportation needs - medical: Not on file  . Transportation needs - non-medical: Not on file  Occupational History  .  Not on file  Tobacco Use  . Smoking status: Never Smoker  . Smokeless tobacco: Never Used  Substance and Sexual Activity  . Alcohol use: Yes    Comment: occ  . Drug use: No  . Sexual activity: Not on file  Other Topics Concern  . Not on file  Social History Narrative  . Not on file   Review of Systems  Constitutional: Negative.   HENT: Negative.   Eyes: Negative.   Respiratory: Negative.   Cardiovascular: Negative.  Negative for chest pain and palpitations.  Endocrine: Negative for polydipsia, polyphagia and polyuria.  Musculoskeletal: Positive for arthralgias and joint swelling.  Skin: Negative.   Allergic/Immunologic: Negative for immunocompromised state.  Neurological: Positive for dizziness. Negative for numbness.  Hematological: Negative.   Psychiatric/Behavioral: Negative.        Objective:   Physical Exam  Constitutional: She is oriented to person, place, and time. She appears well-developed and well-nourished.  HENT:  Head: Normocephalic and atraumatic.  Right Ear: External ear normal.  Left Ear: External ear normal.  Nose: Nose normal.  Mouth/Throat: Oropharynx is clear and moist.  Eyes: Conjunctivae and EOM are normal. Pupils are equal, round, and reactive to light.  Neck: Normal range of motion. Neck supple.  Cardiovascular: Normal rate, regular rhythm, normal heart sounds and intact distal pulses.  Pulmonary/Chest: Effort normal and breath sounds normal.  Abdominal: Soft. Bowel sounds are normal.  Musculoskeletal:       Right knee: She exhibits decreased range of motion. She exhibits no swelling and no erythema.  Neurological: She is alert and oriented to person, place, and time. She has normal reflexes.  Skin: Skin is warm and dry.  Psychiatric: She has a normal mood and affect. Her behavior is normal. Judgment and thought content normal.      BP 136/76 (BP Location: Left Arm, Patient Position: Sitting, Cuff Size: Large)   Pulse 83   Temp 97.7 F  (36.5 C) (Oral)   Resp 16   Ht 5\' 5"  (1.651 m)   Wt (!) 303 lb (137.4 kg)   SpO2 100%   BMI 50.42 kg/m  Assessment & Plan:  Colon cancer screening - Ambulatory referral to Gastroenterology  Breast cancer screening - MM Digital Screening; Future  Chronic pain of both knees Kara Key warrants further evaluation of bilateral knee pain.  Obesity increase the stress on your knees, which can cause chronic pain and lead to other knee-related complications, such as arthritis or osteoarthritis. Regular exercise can lessen and alleviate overweight and obesity-related knee pain, stiffness, and swelling - AMB referral to orthopedics  Osteoarthritis of multiple joints, unspecified osteoarthritis type - AMB referral to orthopedics  Depression, unspecified depression type - buPROPion (WELLBUTRIN SR) 150 MG 12 hr tablet; Take 150 mg daily for 3 days, if well tolerated increase dose to 150 mg twice daily  Dispense: 60 tablet; Refill: 5  Metabolic syndrome Body mass index is 50.42 kg/m. Kara Key is followed by Dr. Olin Pia, [facility]. It was recommended that she start a low fat, low carbohydrate diet. She has not started diet or exercise regimen.  Patient is under the  - Hemoglobin A1c - TSH - CMP and Liver  Hypertension, unspecified type Blood pressure is at goal on current medication regimen.  No proteinuria present, will review renal functioning as results become available - CMP and Liver  History of atrial fibrillation  - EKG 12-Lead  EKG, abnormal Atrial fibrilation on EKG. Patient warrants a referral to cardiology for further workup and evaluation.   - Ambulatory referral to Cardiology   RTC: 3 months for chronic conditions   Kara Neyhart Rennis Petty  MSN, FNP-C Patient Care [facility] [address] [place], [place] [address] [phone]

## 2017-05-23 NOTE — Progress Notes (Signed)
[email or web address]

## 2017-05-24 LAB — CMP AND LIVER
ALBUMIN: 4.5 g/dL (ref 3.5–5.5)
ALK PHOS: 121 IU/L — AB (ref 39–117)
ALT: 17 IU/L (ref 0–32)
AST: 25 IU/L (ref 0–40)
BUN: 11 mg/dL (ref 6–24)
Bilirubin Total: 0.5 mg/dL (ref 0.0–1.2)
Bilirubin, Direct: 0.14 mg/dL (ref 0.00–0.40)
CALCIUM: 9.9 mg/dL (ref 8.7–10.2)
CO2: 22 mmol/L (ref 20–29)
CREATININE: 0.84 mg/dL (ref 0.57–1.00)
Chloride: 101 mmol/L (ref 96–106)
GFR calc non Af Amer: 80 mL/min/{1.73_m2} (ref >59)
GFR, EST AFRICAN AMERICAN: 92 mL/min/{1.73_m2} (ref >59)
Glucose: 85 mg/dL (ref 65–99)
POTASSIUM: 4 mmol/L (ref 3.5–5.2)
SODIUM: 141 mmol/L (ref 134–144)
TOTAL PROTEIN: 8.7 g/dL — AB (ref 6.0–8.5)

## 2017-05-24 LAB — TSH: TSH: 2.87 u[IU]/mL (ref 0.450–4.500)

## 2017-05-27 ENCOUNTER — Telehealth: Payer: Self-pay

## 2017-05-27 NOTE — Telephone Encounter (Signed)
-----   Message from Massie MaroonLachina M Hollis, [place] sent at 05/26/2017  5:00 PM EST ----- Regarding: Lab results Please inform patient that thyroid level is within a normal range.  Follow-up with bariatrics as scheduled.  All other labs were unremarkable.  Follow-up in office as scheduled. Thanks

## 2017-05-27 NOTE — Telephone Encounter (Signed)
CALLED AND LEFT MESSAGE TO ADVISE THAT ALL LABS WERE NORMAL AND TO KEEP NEXT SCHEDULED FOLLOW UP AND TO CALL BACK IF ANY QUESTIONS. THANKS!

## 2017-05-30 ENCOUNTER — Ambulatory Visit: Payer: Self-pay | Admitting: Family Medicine

## 2017-06-06 ENCOUNTER — Other Ambulatory Visit (INDEPENDENT_AMBULATORY_CARE_PROVIDER_SITE_OTHER): Payer: Self-pay

## 2017-06-06 ENCOUNTER — Telehealth (INDEPENDENT_AMBULATORY_CARE_PROVIDER_SITE_OTHER): Payer: Self-pay | Admitting: Orthopaedic Surgery

## 2017-06-06 ENCOUNTER — Telehealth (INDEPENDENT_AMBULATORY_CARE_PROVIDER_SITE_OTHER): Payer: Self-pay | Admitting: Radiology

## 2017-06-06 ENCOUNTER — Ambulatory Visit (INDEPENDENT_AMBULATORY_CARE_PROVIDER_SITE_OTHER): Payer: Managed Care, Other (non HMO)

## 2017-06-06 ENCOUNTER — Encounter (INDEPENDENT_AMBULATORY_CARE_PROVIDER_SITE_OTHER): Payer: Self-pay | Admitting: Orthopaedic Surgery

## 2017-06-06 ENCOUNTER — Ambulatory Visit (INDEPENDENT_AMBULATORY_CARE_PROVIDER_SITE_OTHER): Payer: Managed Care, Other (non HMO) | Admitting: Orthopaedic Surgery

## 2017-06-06 DIAGNOSIS — M25561 Pain in right knee: Secondary | ICD-10-CM | POA: Diagnosis not present

## 2017-06-06 DIAGNOSIS — M1711 Unilateral primary osteoarthritis, right knee: Secondary | ICD-10-CM

## 2017-06-06 DIAGNOSIS — M25562 Pain in left knee: Secondary | ICD-10-CM

## 2017-06-06 DIAGNOSIS — G8929 Other chronic pain: Secondary | ICD-10-CM

## 2017-06-06 DIAGNOSIS — M1712 Unilateral primary osteoarthritis, left knee: Secondary | ICD-10-CM | POA: Diagnosis not present

## 2017-06-06 MED ORDER — DICLOFENAC SODIUM 1 % TD GEL: 2.0000 g | Freq: Four times a day (QID) | TRANSDERMAL | 5 refills | Status: AC

## 2017-06-06 MED ORDER — TRAMADOL HCL 50 MG PO TABS: 50.0000 mg | ORAL_TABLET | Freq: Two times a day (BID) | ORAL | 0 refills | Status: DC | PRN

## 2017-06-06 NOTE — Progress Notes (Signed)
Office Visit Note   Patient: Kara CowperShona Y Key           Date of Birth: 10/05/1965           MRN: [id number] Visit Date: 06/06/2017              Requested by: Massie MaroonHollis, Lachina M, FNP [address] [place], [place] [address] PCP: Quentin AngstJegede, Olugbemiga E, MD   Assessment & Plan: Visit Diagnoses:  1. Chronic pain of both knees   2. Unilateral primary osteoarthritis, right knee   3. Unilateral primary osteoarthritis, left knee     Plan: Impression is 52 year old female with advanced degenerative joint disease of bilateral knees with BMI of 50.  We discussed the importance of weight loss.  We discussed cortisone injections which she would like to hold off on for now.  Prescription for Voltaren gel.  Questions encouraged and answered.  Follow-up as needed.  Follow-Up Instructions: Return if symptoms worsen or fail to improve.   Orders:  Orders Placed This Encounter  Procedures  . XR KNEE 3 VIEW LEFT  . XR KNEE 3 VIEW RIGHT   Meds ordered this encounter  Medications  . diclofenac sodium (VOLTAREN) 1 % GEL    Sig: Apply 2 g topically 4 (four) times daily.    Dispense:  1 Tube    Refill:  5      Procedures: No procedures performed   Clinical Data: No additional findings.   Subjective: Chief Complaint  Patient presents with  . Right Knee - Pain  . Left Knee - Pain    Patient is a very pleasant 52 year old female comes in with 8-year history of bilateral knee pain.  She denies any injuries.  She does endorse constant swelling and pain with weakness and giving way and popping and locking.  She takes Ultram as needed.  She wakes up with constant pain.  She has difficulty with ADLs and night pain.  Denies any numbness and tingling or back pain.    Review of Systems  Constitutional: Negative.   HENT: Negative.   Eyes: Negative.   Respiratory: Negative.   Cardiovascular: Negative.   Endocrine: Negative.   Musculoskeletal: Negative.   Neurological: Negative.     Hematological: Negative.   Psychiatric/Behavioral: Negative.   All other systems reviewed and are negative.    Objective: Vital Signs: There were no vitals taken for this visit.  Physical Exam  Constitutional: She is oriented to person, place, and time. She appears well-developed and well-nourished.  HENT:  Head: Normocephalic and atraumatic.  Eyes: EOM are normal.  Neck: Neck supple.  Pulmonary/Chest: Effort normal.  Abdominal: Soft.  Neurological: She is alert and oriented to person, place, and time.  Skin: Skin is warm. Capillary refill takes less than 2 seconds.  Psychiatric: She has a normal mood and affect. Her behavior is normal. Judgment and thought content normal.  Nursing note and vitals reviewed.   Ortho Exam Bilateral knee exam shows no joint effusion.  Collaterals and cruciates are stable.  Well-preserved joint range of motion.  Positive patellar crepitus. Specialty Comments:  No specialty comments available.  Imaging: Xr Knee 3 View Left  Result Date: 06/06/2017 Advanced degenerative joint disease with varus collapse  Xr Knee 3 View Right  Result Date: 06/06/2017 Advanced degenerative joint disease with varus collapse    PMFS History: Patient Active Problem List   Diagnosis Date Noted  . Essential hypertension 07/22/2016  . Hyperlipidemia 10/24/2015  . Morbid obesity (  HCC) 10/23/2015  . Chronic pain of both knees 10/23/2015  . Acute frontal sinusitis 10/23/2015  . Metabolic syndrome 10/23/2015   Past Medical History:  Diagnosis Date  . A-fib (HCC)   . Arthritis   . Asthma     History reviewed. No pertinent family history.  Past Surgical History:  Procedure Laterality Date  . LAPAROSCOPIC GASTRIC SLEEVE RESECTION    . PARTIAL HYSTERECTOMY     Social History   Occupational History  . Not on file  Tobacco Use  . Smoking status: Never Smoker  . Smokeless tobacco: Never Used  Substance and Sexual Activity  . Alcohol use: Yes    Comment:  occ  . Drug use: No  . Sexual activity: Not on file

## 2017-06-06 NOTE — Telephone Encounter (Signed)
What pharmacy does she want me to call it into?

## 2017-06-06 NOTE — Telephone Encounter (Signed)
Patient called requesting CD of images from 06/06/17. CD will be placed in front desk, patient plans to pick up CD 07/04/17.

## 2017-06-06 NOTE — Telephone Encounter (Signed)
Patient called asking if her Tramadol could be called into her pharmacy. Once done if she could get a call back letting her know. CB # [phone]

## 2017-06-06 NOTE — Telephone Encounter (Signed)
Called Rx into pharm

## 2017-06-06 NOTE — Telephone Encounter (Signed)
Send prescription-[facility]  [address]   [place]  [facility]

## 2017-06-10 ENCOUNTER — Encounter: Payer: Self-pay | Admitting: Family Medicine

## 2017-06-20 DIAGNOSIS — F32A Depression, unspecified: Secondary | ICD-10-CM

## 2017-06-20 DIAGNOSIS — Z8679 Personal history of other diseases of the circulatory system: Secondary | ICD-10-CM

## 2017-06-20 DIAGNOSIS — F329 Major depressive disorder, single episode, unspecified: Secondary | ICD-10-CM | POA: Insufficient documentation

## 2017-06-20 DIAGNOSIS — M545 Low back pain, unspecified: Secondary | ICD-10-CM

## 2017-06-20 HISTORY — DX: Depression, unspecified: F32.A

## 2017-06-20 HISTORY — DX: Low back pain, unspecified: M54.50

## 2017-06-20 HISTORY — DX: Personal history of other diseases of the circulatory system: Z86.79

## 2017-06-23 ENCOUNTER — Encounter: Payer: Self-pay | Admitting: Family Medicine

## 2017-07-02 IMAGING — CT CT RENAL STONE PROTOCOL
2 of 4 series · 11 of 46 positions shown, 12 images · non-contrast
Comparison: None.

CLINICAL DATA: Left flank pain.

EXAM:
CT ABDOMEN AND PELVIS WITHOUT CONTRAST
TECHNIQUE: Multidetector CT imaging of the abdomen and pelvis was performed
following the standard protocol without IV contrast.

[Series 201: stone study, idose (2) · axial · 0.71mm/px · z∈[+114,+504]mm · 8 of 94 slices shown, 9 images]
[im 8/94  soft-tissue]
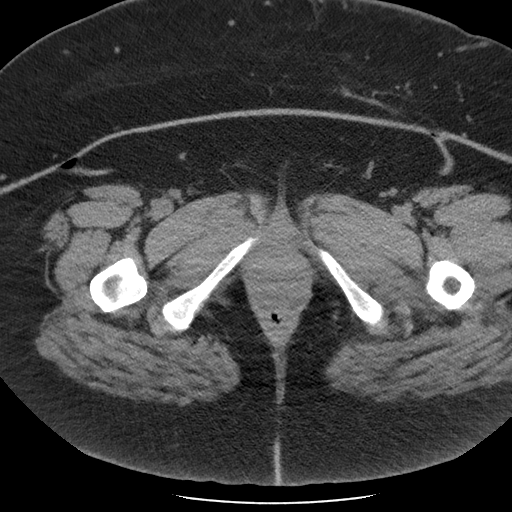
[im 8/94  bone]
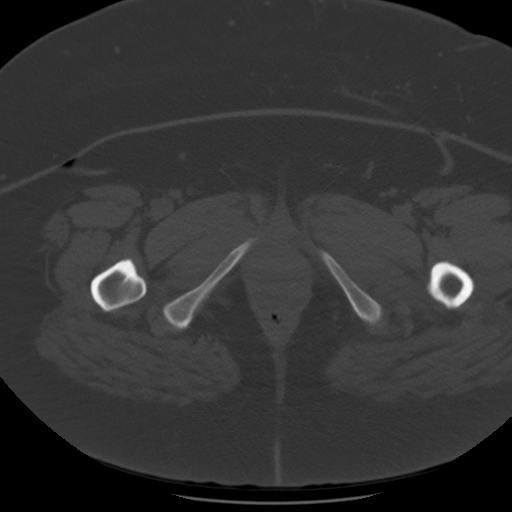
[im 20/94  soft-tissue]
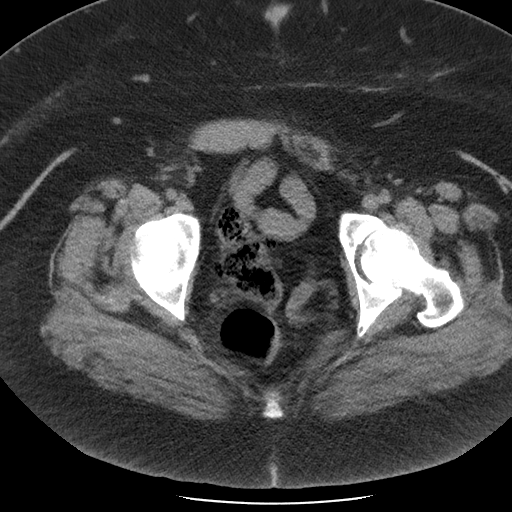
[im 32/94  soft-tissue]
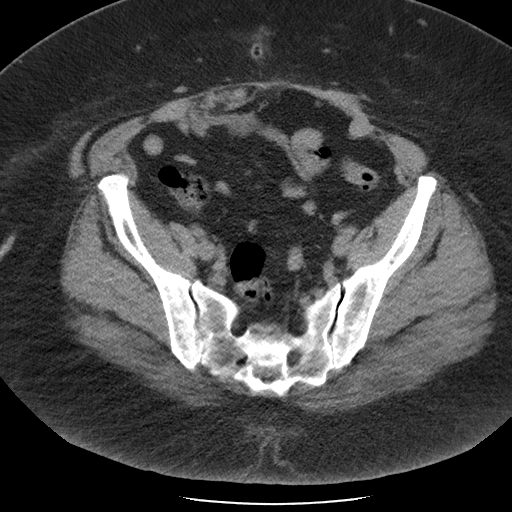
[im 43/94  soft-tissue]
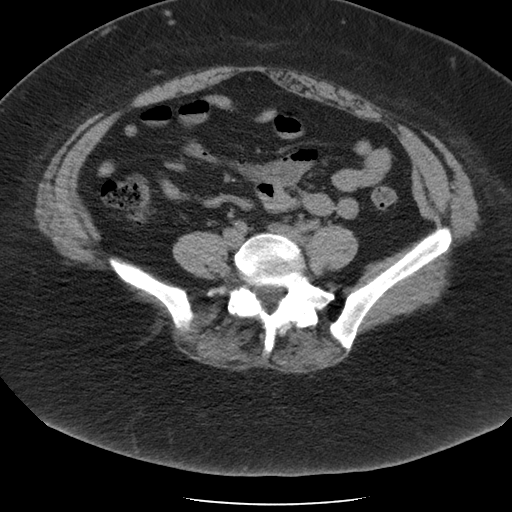
[im 51/94  soft-tissue]
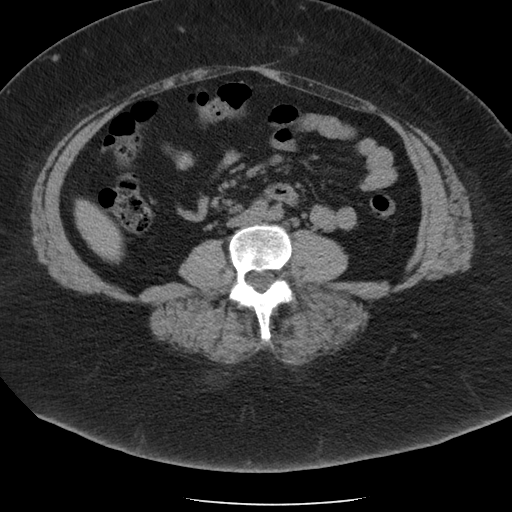
[im 63/94  soft-tissue]
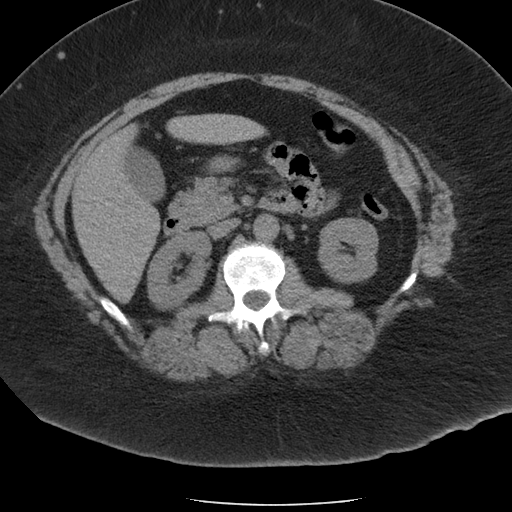
[im 74/94  soft-tissue]
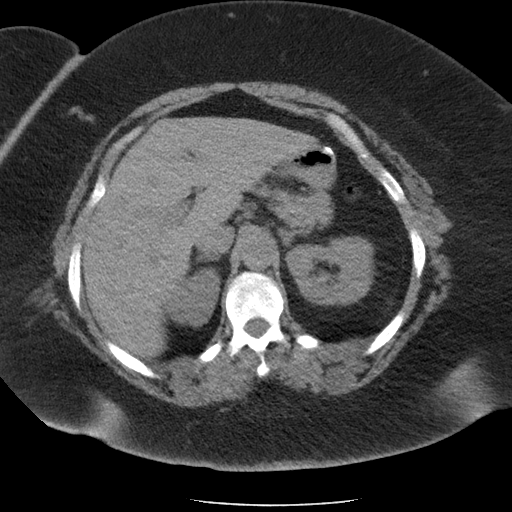
[im 86/94  soft-tissue]
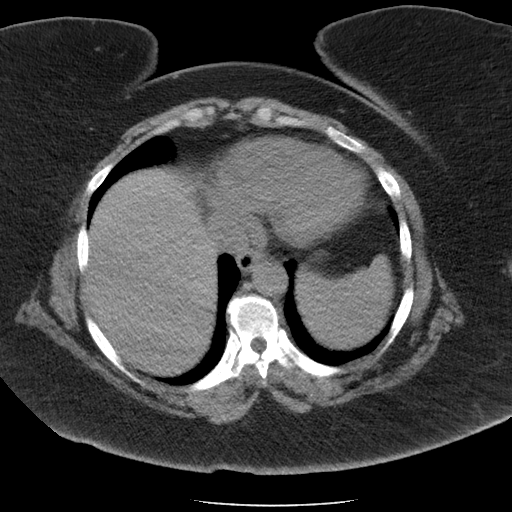

[Series 203: coronals, idose (2) · coronal · 0.45mm/px · 3 of 144 slices shown]
[im 48/144  soft-tissue]
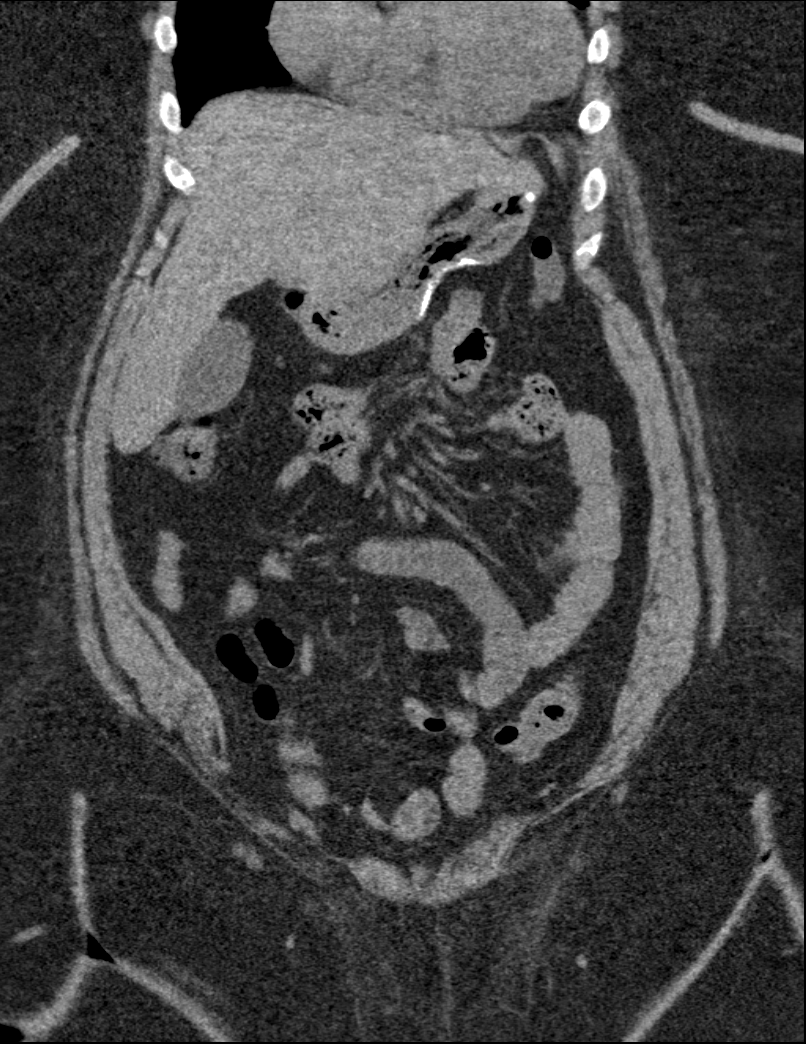
[im 64/144  soft-tissue]
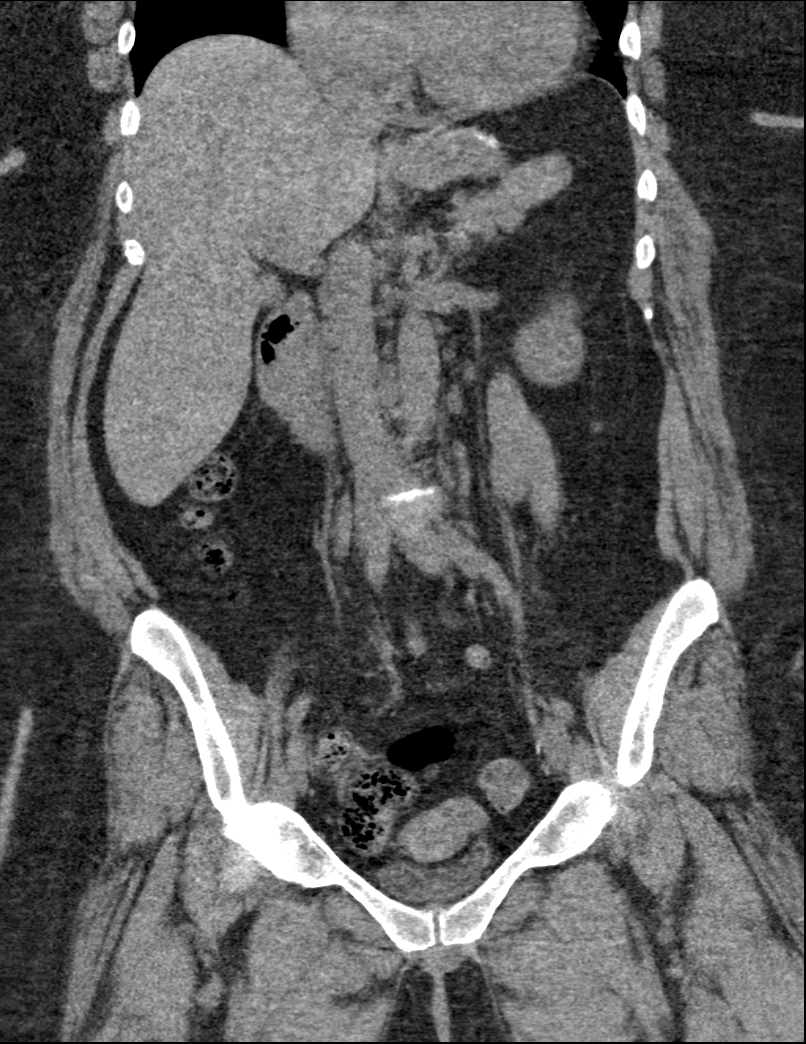
[im 80/144  soft-tissue]
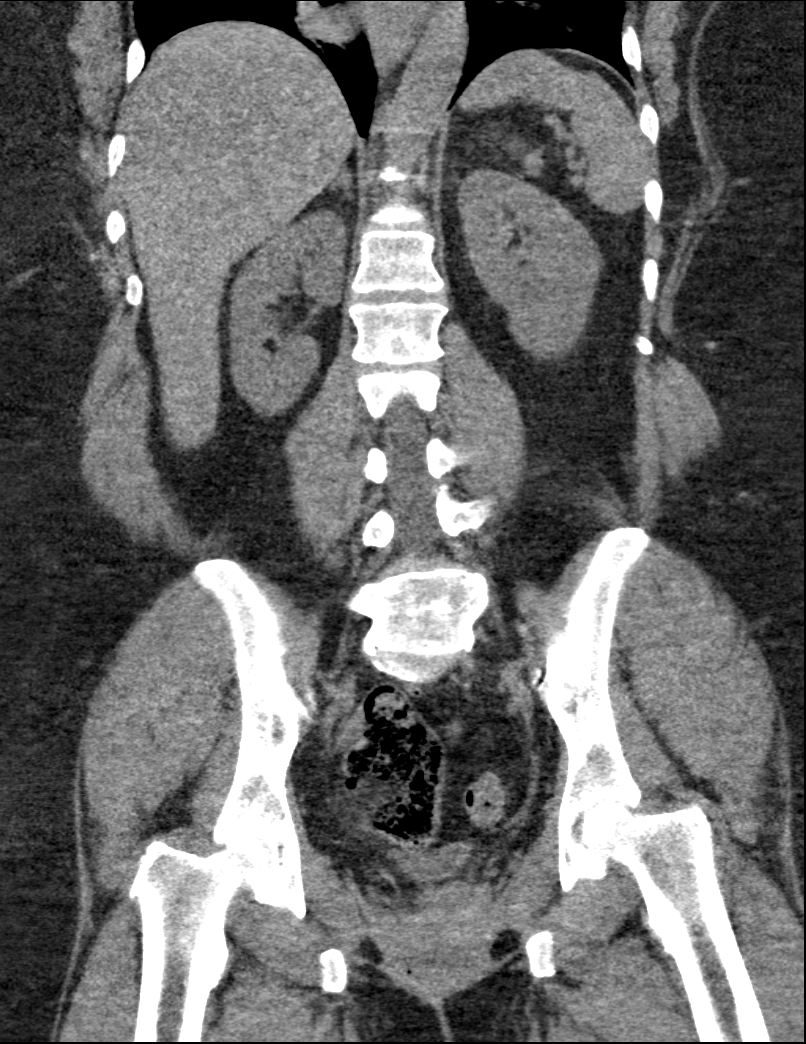

[11 of 46 positions shown; findings below may reference images not displayed]

FINDINGS: Minimal linear atelectasis in the lingula. Heart is at the upper
limits of normal in size.

Punctate nonobstructing stone in the lower left kidney, possible
additional punctate nonobstructing stone in the left mid kidney. No
hydronephrosis or obstructive uropathy. No perinephric stranding.

Liver is enlarged measuring 21.6 cm in cranial caudal dimension. No
evidence of focal lesion allowing for lack contrast. The gallbladder
is physiologically distended, no biliary dilatation. The unenhanced
spleen, pancreas, and adrenal glands are normal.

Postop gastric surgery with staples along the greater curvature. No
gastric distension. There are no dilated or thickened bowel loops.
Small to moderate volume of colonic stool without colonic wall
thickening. The cecum is located in the central pelvis, appendix
courses posteriorly in the midline and is normal. No free air, free
fluid, or intra-abdominal fluid collection.

No retroperitoneal adenopathy. Abdominal aorta is normal in caliber.

Within the pelvis the urinary bladder is minimally distended, no
bladder stone. Uterus appears surgically absent. No adnexal mass. No
pelvic free fluid.

There are no acute or suspicious osseous abnormalities. There is
degenerative change in the lumbar spine with hemi transitional
lumbosacral anatomy.
IMPRESSION: 1. Punctate nonobstructing stone in the left kidney, no obstructive
uropathy.
2. Hepatomegaly.

## 2017-07-04 ENCOUNTER — Ambulatory Visit
Admission: RE | Admit: 2017-07-04 | Discharge: 2017-07-04 | Disposition: A | Discharge status: Home / Self Care | Payer: Managed Care, Other (non HMO) | Source: Ambulatory Visit | Attending: Family Medicine | Admitting: Family Medicine

## 2017-07-04 DIAGNOSIS — Z1239 Encounter for other screening for malignant neoplasm of breast: Secondary | ICD-10-CM

## 2017-07-05 ENCOUNTER — Other Ambulatory Visit: Payer: Self-pay | Admitting: Family Medicine

## 2017-07-05 DIAGNOSIS — R928 Other abnormal and inconclusive findings on diagnostic imaging of breast: Secondary | ICD-10-CM

## 2017-07-08 ENCOUNTER — Ambulatory Visit: Payer: Managed Care, Other (non HMO) | Admitting: Cardiology

## 2017-07-19 ENCOUNTER — Other Ambulatory Visit: Payer: Self-pay | Admitting: Family Medicine

## 2017-07-19 ENCOUNTER — Ambulatory Visit
Admission: RE | Admit: 2017-07-19 | Discharge: 2017-07-19 | Disposition: A | Discharge status: Home / Self Care | Payer: Managed Care, Other (non HMO) | Source: Ambulatory Visit | Attending: Family Medicine | Admitting: Family Medicine

## 2017-07-19 DIAGNOSIS — R928 Other abnormal and inconclusive findings on diagnostic imaging of breast: Secondary | ICD-10-CM

## 2017-07-21 ENCOUNTER — Ambulatory Visit
Admission: RE | Admit: 2017-07-21 | Discharge: 2017-07-21 | Disposition: A | Discharge status: Home / Self Care | Payer: Managed Care, Other (non HMO) | Source: Ambulatory Visit | Attending: Family Medicine | Admitting: Family Medicine

## 2017-07-21 ENCOUNTER — Other Ambulatory Visit (INDEPENDENT_AMBULATORY_CARE_PROVIDER_SITE_OTHER): Payer: Self-pay | Admitting: Orthopaedic Surgery

## 2017-07-21 DIAGNOSIS — R928 Other abnormal and inconclusive findings on diagnostic imaging of breast: Secondary | ICD-10-CM

## 2017-07-21 MED ORDER — TRAMADOL HCL 50 MG PO TABS: 50.0000 mg | ORAL_TABLET | Freq: Two times a day (BID) | ORAL | 0 refills | Status: DC | PRN

## 2017-07-21 NOTE — Telephone Encounter (Signed)
Please advise 

## 2017-07-21 NOTE — Telephone Encounter (Signed)
Called into pharmacy

## 2017-08-18 ENCOUNTER — Telehealth: Payer: Self-pay | Admitting: *Deleted

## 2017-08-18 NOTE — Telephone Encounter (Signed)
Dr Myrtie Neitheranis,  This pt is scheduled to have a PV 4-23 and a direct colon with you 5-7 Tuesday. She has no GI hx.  Her BMI is 50.42. She has a Hx of A Fib, asthma, IDA, HTN, HLD.  At her last PCP visit 05-23-2017 she was noted to be in A Fib . In 2013-2014 she was on Xarelto for this but not currently.   Her PCP re-referred her to cardio.There are no new cardio notes in Epic so I dont believe she has seen Cardio yet.  Do you want to cancel her colon until after she has cardiac clearance ? Do you want her to have an OV with you?  Please advise, Thanks for your time, Hilda LiasMarie

## 2017-08-18 NOTE — Telephone Encounter (Signed)
I cancelled PV and colon as per Dr Myrtie Neitheranis .  Kara Key PV

## 2017-08-18 NOTE — Telephone Encounter (Signed)
   Thank you for your note and diligent chart review.  Please cancel [facility] pre-visit and date for colonoscopy because her A fib needs attention and questions of whether or not that needs anticoagulation must be addressed by cardiology. Her BMI also means procedure would need to be done at hospital endoscopy dept.  Patient will need to see us after cardiology evaluation.  This appears to be routine screening request.  I will copy this to primary care provider so they can refer patient back to us after cardiology evaluation.   Lachina,    Please see above.  It does not appear this patient is quite ready for a screening colonoscopy.  Also, it does not sem she has been evaluated by Cardiology.  Please check in to that and refer patient back to us for office visit to discuss screening afterwards. Thank you. - H. Danis Corinda [facility]

## 2017-08-18 NOTE — Telephone Encounter (Signed)
Will cancel as per Dr Myrtie Neitheranis. . Attempted to call pt- [facility]  Hilda LiasMarie

## 2017-08-18 NOTE — Telephone Encounter (Signed)
Pt returned my call.  She was very upset because she was informed she had to cancel the PV and colon due to her A Fib.  She said she had gotten transportation worked out. Explained to her why we need to cancel per what Dr Myrtie Neitheranis said.  Informed her of the risks associated with her A Fib.and a colon/ prep.  Informed her we have to have total cardiac clearance before we can do a colonoscopy here. Explained she needs to have an OV with Dr Myrtie Neitheranis PRIOR to scheduling a colon again per Dr Myrtie Neitheranis.  Pt asked if she had to pay a co pay to see him, I informed her I would assume but she could ask her insurance.   Pt states she would sign a waiver to have her colon with these issues as she has been in A Fib in the past and I informed her that we cannot do that due to safety and issues that could arise, that Dr Myrtie Neitheranis would not do her procedure until she was cleared AND saw him in the office first..  I told pt she has to have cardiac clearance, she may have to wait several months if she is started on an anticoagulant to come off for this procedure because she could bleed if she had to have any bx or polyps removed so she has time for having to pay a co pay here . Pt states she has not seen cardio because she has not had the co pay for the visit.  She was very upset from the start.  I told her that Dr Myrtie Neitheranis had forwarded this message to Dr Hart RochesterHollis as well.  Encouraged her to call with questions.  She kept telling me I didn't understand that she has to work and have a driver and that was scheduled.  Again told her we cannot do her procedure until she is cleared and sees Dr Myrtie Neitheranis. BMI is also > 50 so will have to be a hospital case.  Pt eventually told me to cancel, which I had already done, and she will let us know. PV and colon were cancelled .  Kara Key PV

## 2017-08-22 ENCOUNTER — Ambulatory Visit: Payer: Managed Care, Other (non HMO) | Admitting: Family Medicine

## 2017-09-20 ENCOUNTER — Encounter: Payer: Managed Care, Other (non HMO) | Admitting: Gastroenterology

## 2017-10-06 ENCOUNTER — Other Ambulatory Visit (INDEPENDENT_AMBULATORY_CARE_PROVIDER_SITE_OTHER): Payer: Self-pay | Admitting: Orthopaedic Surgery

## 2017-10-06 ENCOUNTER — Encounter (INDEPENDENT_AMBULATORY_CARE_PROVIDER_SITE_OTHER): Payer: Self-pay | Admitting: Orthopaedic Surgery

## 2017-10-06 MED ORDER — TRAMADOL HCL 50 MG PO TABS: 50.0000 mg | ORAL_TABLET | Freq: Two times a day (BID) | ORAL | 0 refills | Status: DC | PRN

## 2017-10-06 NOTE — Telephone Encounter (Signed)
Sure thing.  #30 of tramadol

## 2017-10-06 NOTE — Telephone Encounter (Signed)
Please advise 

## 2017-10-07 ENCOUNTER — Other Ambulatory Visit (INDEPENDENT_AMBULATORY_CARE_PROVIDER_SITE_OTHER): Payer: Self-pay

## 2017-10-11 NOTE — Telephone Encounter (Signed)
Your patient 

## 2017-10-17 ENCOUNTER — Ambulatory Visit: Payer: Managed Care, Other (non HMO) | Admitting: Family Medicine

## 2017-12-29 ENCOUNTER — Other Ambulatory Visit (INDEPENDENT_AMBULATORY_CARE_PROVIDER_SITE_OTHER): Payer: Self-pay | Admitting: Orthopaedic Surgery

## 2017-12-29 MED ORDER — TRAMADOL HCL 50 MG PO TABS: 50.0000 mg | ORAL_TABLET | Freq: Two times a day (BID) | ORAL | 0 refills | Status: DC | PRN

## 2017-12-29 NOTE — Telephone Encounter (Signed)
Please advise 

## 2017-12-29 NOTE — Telephone Encounter (Signed)
Rx request 

## 2017-12-30 ENCOUNTER — Encounter: Payer: Self-pay | Admitting: Family Medicine

## 2017-12-30 ENCOUNTER — Ambulatory Visit (INDEPENDENT_AMBULATORY_CARE_PROVIDER_SITE_OTHER): Payer: Managed Care, Other (non HMO) | Admitting: Family Medicine

## 2017-12-30 ENCOUNTER — Ambulatory Visit: Payer: Managed Care, Other (non HMO) | Admitting: Family Medicine

## 2017-12-30 VITALS — BP 115/62 | HR 85 | Temp 97.9°F | Resp 16 | Ht 65.0 in | Wt 299.0 lb

## 2017-12-30 DIAGNOSIS — L659 Nonscarring hair loss, unspecified: Secondary | ICD-10-CM | POA: Diagnosis not present

## 2017-12-30 DIAGNOSIS — Z6841 Body Mass Index (BMI) 40.0 and over, adult: Secondary | ICD-10-CM | POA: Diagnosis not present

## 2017-12-30 DIAGNOSIS — G4700 Insomnia, unspecified: Secondary | ICD-10-CM

## 2017-12-30 MED ORDER — TRAZODONE HCL 50 MG PO TABS: 25.0000 mg | ORAL_TABLET | Freq: Every evening | ORAL | 3 refills | Status: DC | PRN

## 2017-12-30 NOTE — Progress Notes (Signed)
[facility]  Provider: Mike GipAndre Cebastian Neis, FNP   Hypertension Follow Up Visit  SUBJECTIVE:  Kara Key is a 52 y.o. female with hypertension. She states that she had gastric sleeve and is concerned due to remaining at the same weight. She is currently not exercising and lives a stressful life with taking care of her godchildren and battling for custody.   24 hr diet recall: oreos, chicken wings, doritos, chicken nugget meal, mini candy bars, ice cream.   New concerns: Patient would like further evaluation of alopecia. Has tried otc medications and supplements without relief.  Patient reports that she is followed by ortho for knee pain. Needs knee replacement but will have to lose weight first.   Current Outpatient Medications  Medication Sig Dispense Refill  . albuterol (PROVENTIL HFA;VENTOLIN HFA) 108 (90 Base) MCG/ACT inhaler Inhale 2 puffs into the lungs every 4 (four) hours as needed for wheezing or shortness of breath (cough, shortness of breath or wheezing.). 54 Inhaler 3  . Biotin 5000 MCG CAPS Take 1 capsule by mouth 1 day or 1 dose.    . Cholecalciferol (VITAMIN D3) 1000 units CAPS Take by mouth.    . diclofenac sodium (VOLTAREN) 1 % GEL Apply 2 g topically 4 (four) times daily. 1 Tube 5  . levocetirizine (XYZAL) 5 MG tablet Take 1 tablet (5 mg total) by mouth every evening. 30 tablet 5  . multivitamin-iron-minerals-folic acid (CENTRUM) chewable tablet Chew 1 tablet by mouth daily.    . psyllium (METAMUCIL) 58.6 % packet Take 1 packet by mouth daily.    Marland Kitchen. atorvastatin (LIPITOR) 20 MG tablet Take 1 tablet (20 mg total) by mouth daily. (Patient not taking: Reported on 05/23/2017) 90 tablet 1  . buPROPion (WELLBUTRIN SR) 150 MG 12 hr tablet Take 150 mg daily for 3 days, if well tolerated increase dose to 150 mg twice daily (Patient not taking: Reported on 12/30/2017) 60 tablet 5  . hydrochlorothiazide (HYDRODIURIL) 12.5 MG tablet  Take 1 tablet (12.5 mg total) by mouth daily. (Patient not taking: Reported on 05/23/2017) 90 tablet 0  . ibuprofen (ADVIL,MOTRIN) 800 MG tablet Take 1 tablet (800 mg total) by mouth 3 (three) times daily. (Patient not taking: Reported on 05/23/2017) 21 tablet 0  . traMADol (ULTRAM) 50 MG tablet Take 1 tablet (50 mg total) by mouth 2 (two) times daily as needed. 30 tablet 0  . traZODone (DESYREL) 50 MG tablet Take 0.5-1 tablets (25-50 mg total) by mouth at bedtime as needed for sleep. 30 tablet 3   No current facility-administered medications for this visit.     No results found for this or any previous visit (from the past 2160 hour(s)).  Hypertension ROS: Review of Systems  Constitutional: Negative.   HENT: Negative.   Eyes: Negative.   Respiratory: Negative.   Cardiovascular: Negative.   Gastrointestinal: Negative.   Genitourinary: Negative.   Musculoskeletal: Positive for joint pain.  Skin: Negative.        alopecia  Neurological: Negative.   Psychiatric/Behavioral: Negative.      OBJECTIVE:   BP 115/62 (BP Location: Left Arm, Patient Position: Sitting, Cuff Size: Large)   Pulse 85   Temp 97.9 F (36.6 C) (Oral)   Resp 16   Ht 5\' 5"  (1.651 m)   Wt 299 lb (135.6 kg)   SpO2 99%   BMI 49.76 kg/m   Physical Exam  Constitutional: She is oriented to person, place, and time. She  appears well-developed and well-nourished.  HENT:  Head: Normocephalic and atraumatic.  Eyes: Pupils are equal, round, and reactive to light. Conjunctivae and EOM are normal.  Neck: Normal range of motion. Neck supple.  Cardiovascular: Normal rate, regular rhythm and intact distal pulses. Exam reveals no gallop and no friction rub.  No murmur heard. Pulmonary/Chest: Effort normal and breath sounds normal. No respiratory distress.  Abdominal: Soft. Bowel sounds are normal.  Musculoskeletal: She exhibits no edema.  Lymphadenopathy:    She has no cervical adenopathy.  Neurological: She is alert and  oriented to person, place, and time.  Skin: Skin is warm and dry.   Patient with hair loss.   Psychiatric: She has a normal mood and affect. Her behavior is normal. Judgment and thought content normal.  Nursing note and vitals reviewed.    ASSESSMENT/PLAN:   1. Alopecia - Ambulatory referral to Dermatology  2. Insomnia, unspecified type Patient to do trial of trazodone 25-50mg  po QHS. Education given on sleep hygiene.   3. Class 3 severe obesity due to excess calories with body mass index (BMI) of 50.0 to 59.9 in adult, unspecified whether serious comorbidity present (HCC) We discussed her 24 hour diet recall in depth. Recommend food journal. Handouts given. Pt to bring to next visit. Discussed walking every day for fitness. Discussed stress reduction through meditation and mindfulness.    The patient is asked to make an attempt to improve diet and exercise patterns to aid in medical management of this problem.  Return to care as scheduled and prn. Patient verbalized understanding and agreed with plan of care.    [name], FNP-BC [facility] [address]  [place], [place] [address] [phone]

## 2017-12-30 NOTE — Patient Instructions (Signed)

## 2018-01-17 ENCOUNTER — Other Ambulatory Visit (INDEPENDENT_AMBULATORY_CARE_PROVIDER_SITE_OTHER): Payer: Self-pay

## 2018-01-17 ENCOUNTER — Encounter (INDEPENDENT_AMBULATORY_CARE_PROVIDER_SITE_OTHER): Payer: Self-pay | Admitting: Orthopaedic Surgery

## 2018-01-17 MED ORDER — TRAMADOL HCL 50 MG PO TABS: 50.0000 mg | ORAL_TABLET | Freq: Two times a day (BID) | ORAL | 0 refills | Status: AC | PRN

## 2018-01-17 NOTE — Telephone Encounter (Signed)
30

## 2018-03-29 ENCOUNTER — Encounter (INDEPENDENT_AMBULATORY_CARE_PROVIDER_SITE_OTHER): Payer: Self-pay | Admitting: Orthopaedic Surgery

## 2018-05-15 ENCOUNTER — Ambulatory Visit: Payer: Managed Care, Other (non HMO) | Admitting: Family Medicine

## 2018-05-25 ENCOUNTER — Encounter (INDEPENDENT_AMBULATORY_CARE_PROVIDER_SITE_OTHER): Payer: Self-pay | Admitting: Orthopaedic Surgery

## 2018-05-25 ENCOUNTER — Other Ambulatory Visit (INDEPENDENT_AMBULATORY_CARE_PROVIDER_SITE_OTHER): Payer: Self-pay

## 2018-05-25 MED ORDER — ACETAMINOPHEN-CODEINE #3 300-30 MG PO TABS: 1.0000 | ORAL_TABLET | Freq: Two times a day (BID) | ORAL | 0 refills | Status: DC | PRN

## 2018-05-25 NOTE — Telephone Encounter (Signed)
#  30 tylenol #3 thanks.

## 2018-05-26 ENCOUNTER — Encounter: Payer: Self-pay | Admitting: Family Medicine

## 2018-05-26 ENCOUNTER — Ambulatory Visit (INDEPENDENT_AMBULATORY_CARE_PROVIDER_SITE_OTHER): Payer: Managed Care, Other (non HMO) | Admitting: Family Medicine

## 2018-05-26 VITALS — BP 124/73 | HR 75 | Temp 97.8°F | Resp 16 | Ht 65.0 in | Wt 297.0 lb

## 2018-05-26 DIAGNOSIS — J0110 Acute frontal sinusitis, unspecified: Secondary | ICD-10-CM

## 2018-05-26 DIAGNOSIS — J4541 Moderate persistent asthma with (acute) exacerbation: Secondary | ICD-10-CM | POA: Diagnosis not present

## 2018-05-26 MED ORDER — AMOXICILLIN-POT CLAVULANATE 875-125 MG PO TABS: 1.0000 | ORAL_TABLET | Freq: Two times a day (BID) | ORAL | 0 refills | Status: AC

## 2018-05-26 MED ORDER — METHYLPREDNISOLONE 4 MG PO TBPK: ORAL_TABLET | ORAL | 0 refills | Status: AC

## 2018-05-26 MED ORDER — IPRATROPIUM BROMIDE 0.02 % IN SOLN
0.5000 mg | Freq: Once | RESPIRATORY_TRACT | Status: AC
  Administered 2018-05-26: 0.5 mg via RESPIRATORY_TRACT

## 2018-05-26 MED ORDER — ALBUTEROL SULFATE (2.5 MG/3ML) 0.083% IN NEBU
2.5000 mg | INHALATION_SOLUTION | Freq: Once | RESPIRATORY_TRACT | Status: AC
  Administered 2018-05-26: 2.5 mg via RESPIRATORY_TRACT

## 2018-05-26 NOTE — Progress Notes (Signed)
[facility]   Progress Note: General Provider: Mike GipAndre Tomika Eckles, FNP  SUBJECTIVE:   Kara Key is a 53 y.o. female who  has a past medical history of A-fib (HCC), Acute frontal sinusitis (10/23/2015), Allergic rhinitis (06/15/2013), Arthritis, Asthma, Atrial fibrillation (HCC) (02/07/2013), Chronic pain of both knees (10/23/2015), Depression (06/20/2017), History of atrial fibrillation (06/20/2017), Hyperlipidemia (10/24/2015), Impaired fasting glucose (11/06/2012), Iron deficiency (12/10/2011), Low back pain (06/20/2017), Metabolic syndrome (10/23/2015), Mild diastolic dysfunction (05/01/2013), Mitral insufficiency (04/26/2013), Morbid obesity (HCC) (10/23/2015), Primary osteoarthritis of both knees (10/26/2013), Sciatica (12/10/2011), Seasonal allergies (02/07/2013), 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  She also states that she has had sinus pain and congestion for over 7 days.  Over-the-counter medications not relieving pain and symptoms.  Denies chest pain shortness of breath dizziness or leg swelling.  Has a history of asthma and has noted wheezing over the past 7 days.  Review of Systems  Constitutional: Negative.   HENT: Positive for congestion and sinus pain.   Eyes: Negative.   Respiratory: Positive for cough.   Cardiovascular: Negative.   Gastrointestinal: Negative.   Genitourinary: Negative.   Musculoskeletal: Positive for joint pain.  Skin: Negative.   Neurological: Negative.   Psychiatric/Behavioral: Negative.      OBJECTIVE: BP 124/73 (BP Location: Left Arm, Patient Position: Sitting, Cuff Size: Large)   Pulse 75   Temp 97.8 F (36.6 C) (Oral)   Resp 16   Ht 5\' 5"  (1.651 m)   Wt  297 lb (134.7 kg)   SpO2 100%   BMI 49.42 kg/m   Wt Readings from Last 3 Encounters:  05/26/18 297 lb (134.7 kg)  12/30/17 299 lb (135.6 kg)  05/23/17 (!) 303 lb (137.4 kg)     Physical Exam Vitals signs and nursing note reviewed.  Constitutional:      General: She is not in acute distress.    Appearance: She is well-developed.  HENT:     Head: Normocephalic and atraumatic.     Nose: Congestion present.     Right Turbinates: Enlarged.     Left Turbinates: Enlarged.     Right Sinus: Frontal sinus tenderness present.     Left Sinus: Frontal sinus tenderness present.     Mouth/Throat:     Mouth: Mucous membranes are moist.     Pharynx: Oropharynx is clear.  Eyes:     Extraocular Movements: Extraocular movements intact.     Conjunctiva/sclera: Conjunctivae normal.     Pupils: Pupils are equal, round, and reactive to light.  Neck:     Musculoskeletal: Normal range of motion.  Cardiovascular:     Rate and Rhythm: Normal rate and regular rhythm.     Heart sounds: Normal heart sounds.  Pulmonary:     Effort: Pulmonary effort is normal. No respiratory distress.     Breath sounds: Examination of the right-middle field reveals wheezing. Examination of the right-lower field reveals wheezing. Wheezing present. No decreased breath sounds, rhonchi or rales.  Abdominal:     General: Bowel sounds are normal. There is no distension.     Palpations: Abdomen is soft.  Musculoskeletal: Normal range of motion.  Skin:    General: Skin is warm and dry.  Neurological:     Mental Status: She is alert  and oriented to person, place, and time.  Psychiatric:        Behavior: Behavior normal.        Thought Content: Thought content normal.     ASSESSMENT/PLAN: 1. Acute non-recurrent frontal sinusitis Discussed sinus lavage 2 times a day at minimum.  - amoxicillin-clavulanate (AUGMENTIN) 875-125 MG tablet; Take 1 tablet by mouth every 12 (twelve) hours for 7 days.  Dispense: 14 tablet; Refill:  0 - methylPREDNISolone (MEDROL DOSEPAK) 4 MG TBPK tablet; Take as directed on pack  Dispense: 21 tablet; Refill: 0  2. Moderate persistent asthma with acute exacerbation - albuterol (PROVENTIL) (2.5 MG/3ML) 0.083% nebulizer solution 2.5 mg - ipratropium (ATROVENT) nebulizer solution 0.5 mg   Paperwork filled out for patient during the examination.  Return in about 3 months (around 08/25/2018).    The patient was given clear instructions to go to ER or return to medical center if symptoms do not improve, worsen or new problems develop. The patient verbalized understanding and agreed with plan of care.   Ms. Kara Key. Riley Lam, FNP-BC [facility] [address] [place], [place] [address] [phone]

## 2018-05-26 NOTE — Patient Instructions (Signed)

## 2018-06-30 ENCOUNTER — Other Ambulatory Visit (INDEPENDENT_AMBULATORY_CARE_PROVIDER_SITE_OTHER): Payer: Self-pay | Admitting: Orthopaedic Surgery

## 2018-06-30 MED ORDER — ACETAMINOPHEN-CODEINE #3 300-30 MG PO TABS: 1.0000 | ORAL_TABLET | Freq: Two times a day (BID) | ORAL | 0 refills | Status: DC | PRN

## 2018-06-30 NOTE — Telephone Encounter (Signed)
Xu pt 

## 2018-06-30 NOTE — Telephone Encounter (Signed)
Rx request 

## 2018-07-17 ENCOUNTER — Other Ambulatory Visit (INDEPENDENT_AMBULATORY_CARE_PROVIDER_SITE_OTHER): Payer: Self-pay | Admitting: Physician Assistant

## 2018-07-17 ENCOUNTER — Telehealth (INDEPENDENT_AMBULATORY_CARE_PROVIDER_SITE_OTHER): Payer: Self-pay | Admitting: Orthopedic Surgery

## 2018-07-17 MED ORDER — ACETAMINOPHEN-CODEINE #3 300-30 MG PO TABS: 1.0000 | ORAL_TABLET | Freq: Two times a day (BID) | ORAL | 0 refills | Status: DC | PRN

## 2018-07-17 NOTE — Telephone Encounter (Signed)
done

## 2018-07-17 NOTE — Telephone Encounter (Signed)
See message below. If so please send in Rx to pharm. Thank you.

## 2018-07-17 NOTE — Telephone Encounter (Signed)
Ms. Kumagai states that she needs a refill of Tylenol III called into the pharmacy.  She is staying in [place] now and needs it called into the [facility], their phone#[phone].

## 2018-08-25 ENCOUNTER — Ambulatory Visit: Payer: Managed Care, Other (non HMO) | Admitting: Family Medicine

## 2018-09-06 ENCOUNTER — Telehealth (INDEPENDENT_AMBULATORY_CARE_PROVIDER_SITE_OTHER): Payer: Self-pay | Admitting: Orthopedic Surgery

## 2018-09-06 ENCOUNTER — Other Ambulatory Visit: Payer: Self-pay

## 2018-09-06 DIAGNOSIS — G4700 Insomnia, unspecified: Secondary | ICD-10-CM

## 2018-09-06 MED ORDER — TRAZODONE HCL 50 MG PO TABS: 25.0000 mg | ORAL_TABLET | Freq: Every evening | ORAL | 3 refills | Status: AC | PRN

## 2018-09-06 NOTE — Telephone Encounter (Signed)
Kara Key called in to get a refill of her pain medication: acetaminophen-codeine (TYLENOL #3) 300-30 MG tablet

## 2018-09-07 ENCOUNTER — Other Ambulatory Visit (INDEPENDENT_AMBULATORY_CARE_PROVIDER_SITE_OTHER): Payer: Self-pay

## 2018-09-07 ENCOUNTER — Telehealth (INDEPENDENT_AMBULATORY_CARE_PROVIDER_SITE_OTHER): Payer: Self-pay | Admitting: Orthopaedic Surgery

## 2018-09-07 MED ORDER — ACETAMINOPHEN-CODEINE #3 300-30 MG PO TABS: 1.0000 | ORAL_TABLET | Freq: Two times a day (BID) | ORAL | 0 refills | Status: DC | PRN

## 2018-09-07 NOTE — Telephone Encounter (Signed)
Yes #30

## 2018-09-07 NOTE — Telephone Encounter (Signed)
Patient called regarding her Rx.  Since there seems to be a problem with contacting the Pharmacy, she is requesting that it be sent to [facility] on [address]   Pt cb  [phone]

## 2018-09-07 NOTE — Telephone Encounter (Signed)
Send Rx to [facility].

## 2018-09-07 NOTE — Telephone Encounter (Signed)
Tried calling this into pharm multiple times. Line is busy. Can you send into pharm please and thank you.

## 2018-09-07 NOTE — Telephone Encounter (Signed)
Please advise 

## 2018-09-12 ENCOUNTER — Telehealth (INDEPENDENT_AMBULATORY_CARE_PROVIDER_SITE_OTHER): Payer: Self-pay | Admitting: Orthopaedic Surgery

## 2018-09-12 NOTE — Telephone Encounter (Signed)
Pt is requesting refill on Tylenol #3. Last refill was 07/17/18 #20

## 2018-09-12 NOTE — Telephone Encounter (Signed)
Patient lmom requesting to have Rx for Tylenol #3 sent to [facility] in [place].

## 2018-09-12 NOTE — Telephone Encounter (Signed)
We can refill #30.    Thanks.

## 2018-09-13 ENCOUNTER — Other Ambulatory Visit (INDEPENDENT_AMBULATORY_CARE_PROVIDER_SITE_OTHER): Payer: Self-pay | Admitting: Physician Assistant

## 2018-09-13 NOTE — Telephone Encounter (Signed)
Oh then never mind.  Too soon

## 2018-09-13 NOTE — Telephone Encounter (Signed)
It looks like you just filled #30 6 days ago (1 tab bid).  Do you still want me to go ahead and call in early?

## 2018-09-15 ENCOUNTER — Other Ambulatory Visit (INDEPENDENT_AMBULATORY_CARE_PROVIDER_SITE_OTHER): Payer: Self-pay | Admitting: Orthopaedic Surgery

## 2018-09-15 NOTE — Telephone Encounter (Signed)
Please advise 

## 2018-09-15 NOTE — Telephone Encounter (Signed)
Last rx for 15 days.  Should last until 5/7.  Still too early to refill.

## 2018-09-26 ENCOUNTER — Telehealth: Payer: Self-pay | Admitting: Physician Assistant

## 2018-09-26 ENCOUNTER — Other Ambulatory Visit: Payer: Self-pay | Admitting: Physician Assistant

## 2018-09-26 ENCOUNTER — Other Ambulatory Visit: Payer: Self-pay

## 2018-09-26 MED ORDER — ACETAMINOPHEN-CODEINE #3 300-30 MG PO TABS: 1.0000 | ORAL_TABLET | Freq: Two times a day (BID) | ORAL | 0 refills | Status: AC | PRN

## 2018-09-26 NOTE — Telephone Encounter (Signed)
We have not seen her in almost 1 1/2 years.  You can call in one more rx, but after that, she needs to come back in and be seen or have her pcp take over meds

## 2018-09-26 NOTE — Telephone Encounter (Signed)
See other message

## 2018-09-26 NOTE — Telephone Encounter (Signed)
Patient asked if the Rx can be sent to the at [facility] pharmacy in [place]   [place]

## 2018-09-26 NOTE — Telephone Encounter (Signed)
See messages below.  

## 2018-09-26 NOTE — Telephone Encounter (Signed)
Called patient no answer. Need to advise on message below.  

## 2018-09-26 NOTE — Telephone Encounter (Signed)
Rx called into pharm 

## 2018-09-26 NOTE — Telephone Encounter (Signed)
Patient asked if the Rx can be sent to the at [facility] pharmacy in [place]   [place]   

## 2018-09-26 NOTE — Telephone Encounter (Signed)
Patient called back concerning refill of Rx (Tylenol 3) The number to contact patient is [phone]

## 2018-09-26 NOTE — Telephone Encounter (Signed)
Patient aware.

## 2018-10-28 IMAGING — DX DG FOOT COMPLETE 3+V*R*
3 series · 3 of 3 positions shown · non-contrast
Comparison: None.

CLINICAL DATA: Right fifth toe pain.

EXAM:
RIGHT FOOT COMPLETE - 3+ VIEW

[x foot ap right]
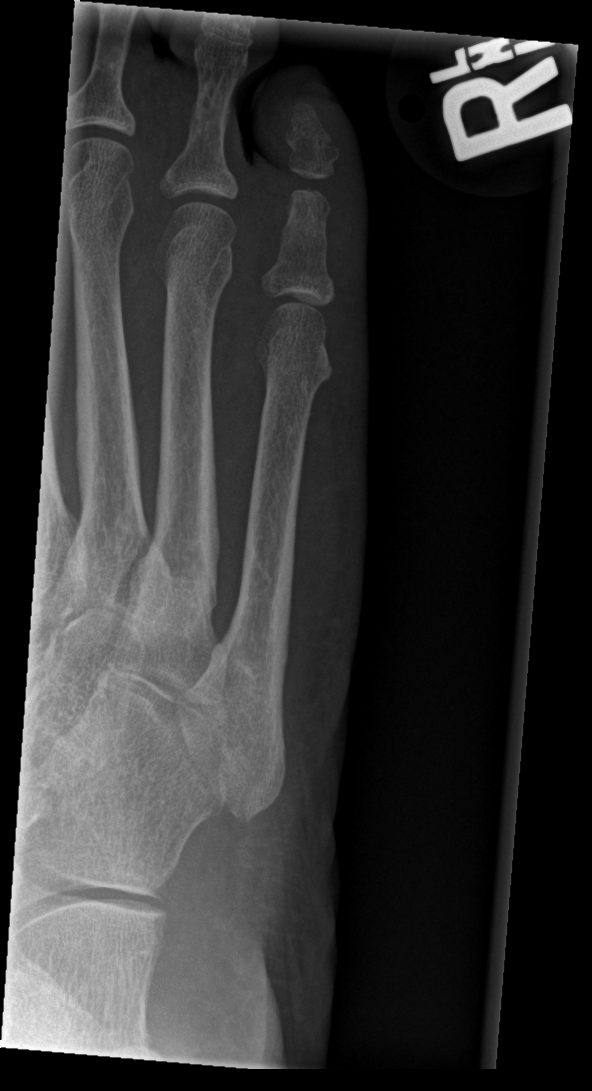

[x foot obl right]
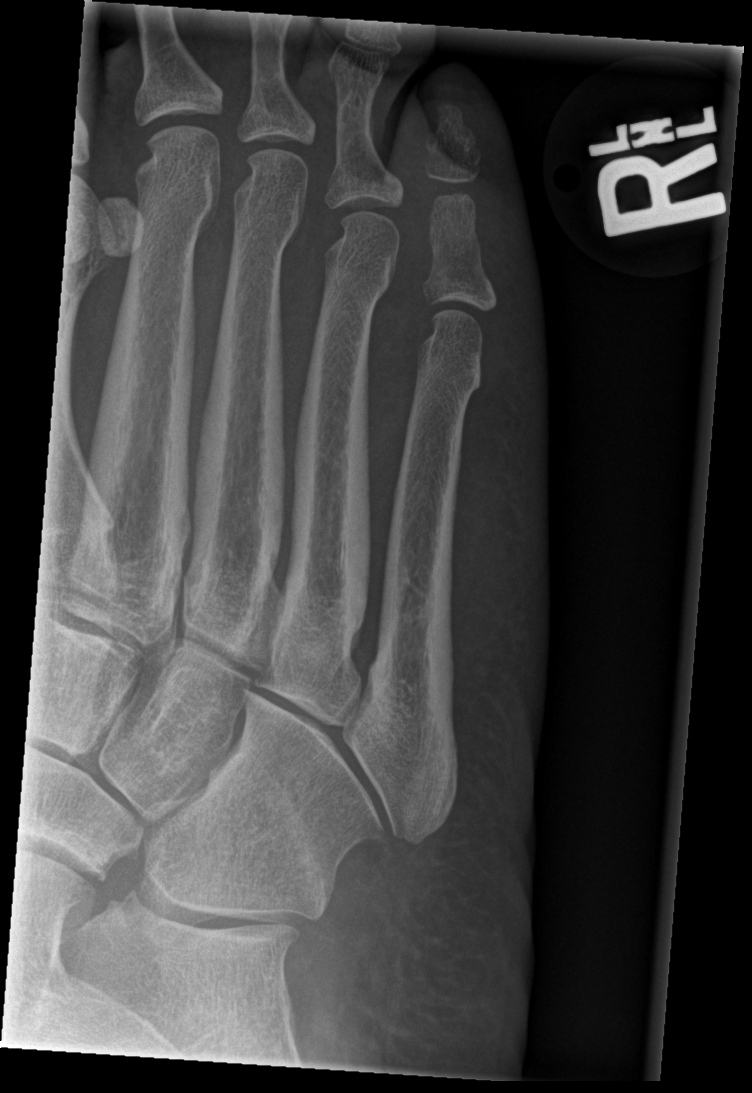

[x foot lat right]
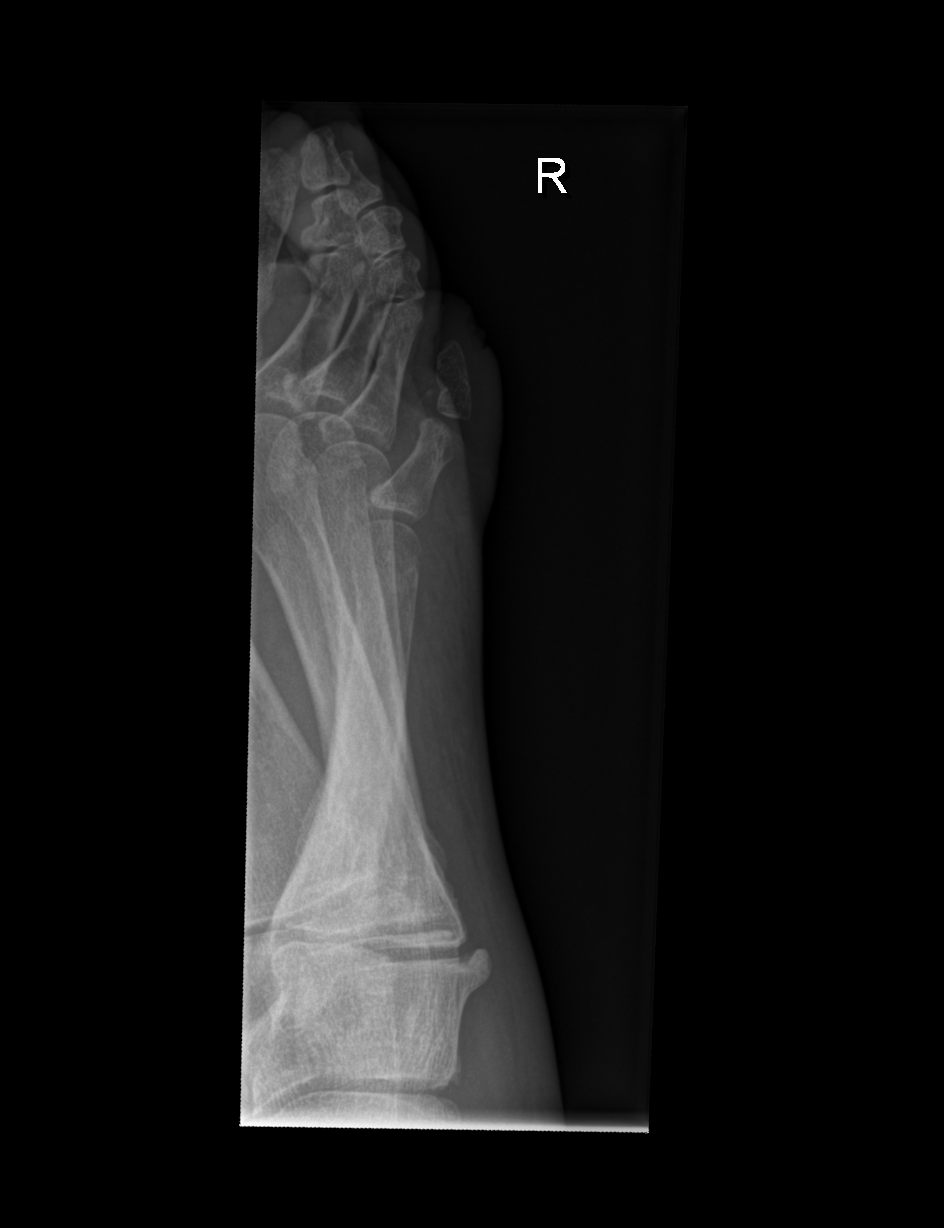

[3 of 3 positions shown; findings below may reference images not displayed]

FINDINGS: There is no evidence of fracture or dislocation. There is no
evidence of arthropathy or other focal bone abnormality. Soft
tissues are unremarkable.
IMPRESSION: Negative.

## 2018-11-01 ENCOUNTER — Ambulatory Visit: Payer: Managed Care, Other (non HMO) | Admitting: Family Medicine

## 2019-04-24 ENCOUNTER — Ambulatory Visit: Payer: Managed Care, Other (non HMO) | Admitting: Family Medicine

## 2019-07-27 ENCOUNTER — Other Ambulatory Visit: Payer: Self-pay | Admitting: Family Medicine

## 2019-07-27 DIAGNOSIS — G4700 Insomnia, unspecified: Secondary | ICD-10-CM

## 2022-06-17 DEATH — deceased
